# Patient Record
Sex: Male | Born: 1935 | Race: White | Hispanic: No | Marital: Married | Smoking: Never smoker
Health system: Southern US, Community
[De-identification: ages and names within clinical notes are randomized; demographics above are authoritative.]

---

## 1992-08-30 HISTORY — PX: TOTAL HIP ARTHROPLASTY: SHX124

## 1992-08-30 HISTORY — PX: CORONARY ANGIOPLASTY WITH STENT PLACEMENT: SHX49

## 2014-03-23 ENCOUNTER — Emergency Department: Payer: Self-pay | Admitting: Emergency Medicine

## 2020-07-31 ENCOUNTER — Other Ambulatory Visit: Payer: Self-pay | Admitting: Neurology

## 2020-07-31 ENCOUNTER — Other Ambulatory Visit (HOSPITAL_COMMUNITY): Payer: Self-pay | Admitting: Neurology

## 2020-07-31 DIAGNOSIS — G250 Essential tremor: Secondary | ICD-10-CM

## 2020-08-13 ENCOUNTER — Other Ambulatory Visit: Payer: Self-pay

## 2020-08-13 ENCOUNTER — Ambulatory Visit (HOSPITAL_COMMUNITY)
Admission: RE | Admit: 2020-08-13 | Discharge: 2020-08-13 | Disposition: A | Discharge status: Home / Self Care | Payer: Medicare PPO | Source: Ambulatory Visit | Attending: Neurology | Admitting: Neurology

## 2020-08-13 DIAGNOSIS — G250 Essential tremor: Secondary | ICD-10-CM

## 2020-08-13 MED ORDER — GADOBUTROL 1 MMOL/ML IV SOLN
7.0000 mL | Freq: Once | INTRAVENOUS | Status: AC | PRN
  Administered 2020-08-13: 7 mL via INTRAVENOUS

## 2020-10-09 ENCOUNTER — Ambulatory Visit: Payer: Self-pay | Admitting: Nurse Practitioner

## 2021-06-03 ENCOUNTER — Other Ambulatory Visit: Payer: Self-pay

## 2021-06-03 ENCOUNTER — Emergency Department
Admission: EM | Admit: 2021-06-03 | Discharge: 2021-06-03 | Disposition: A | Discharge status: Home / Self Care | Payer: Medicare PPO | Attending: Emergency Medicine | Admitting: Emergency Medicine

## 2021-06-03 ENCOUNTER — Emergency Department: Payer: Medicare PPO

## 2021-06-03 DIAGNOSIS — Z96642 Presence of left artificial hip joint: Secondary | ICD-10-CM | POA: Insufficient documentation

## 2021-06-03 DIAGNOSIS — X501XXA Overexertion from prolonged static or awkward postures, initial encounter: Secondary | ICD-10-CM | POA: Insufficient documentation

## 2021-06-03 DIAGNOSIS — Z955 Presence of coronary angioplasty implant and graft: Secondary | ICD-10-CM | POA: Insufficient documentation

## 2021-06-03 DIAGNOSIS — Y9389 Activity, other specified: Secondary | ICD-10-CM | POA: Insufficient documentation

## 2021-06-03 DIAGNOSIS — M7632 Iliotibial band syndrome, left leg: Secondary | ICD-10-CM | POA: Insufficient documentation

## 2021-06-03 DIAGNOSIS — M79662 Pain in left lower leg: Secondary | ICD-10-CM | POA: Diagnosis present

## 2021-06-03 NOTE — ED Notes (Signed)
Patient stable and discharged with all personal belongings and AVS. AVS and discharge instructions reviewed with patient and opportunity for questions provided.   

## 2021-06-03 NOTE — ED Triage Notes (Signed)
C/O left hip pain.  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  States had to bend and twist because of a low hanging branch.  AAOx3.  Skin warm and dry. NAD. Ambulates with easy and steady gait.

## 2021-06-03 NOTE — Discharge Instructions (Addendum)
LeftYou may use ibuprofen up to 600 mg or naproxen up to 250 mg every 8 hours as needed for continued left leg pain.

## 2021-06-03 NOTE — ED Provider Notes (Signed)
[facility] Emergency Department Provider Note   ____________________________________________   Event Date/Time   First MD Initiated Contact with Patient 06/03/21 1637     (approximate)  I have reviewed the triage vital signs and the nursing notes.   HISTORY  Chief Complaint Hip Pain    HPI Mark Barber is a 87 y.o. male who presents for left lateral lower extremity pain  LOCATION: Left hip to knee on the left lower extremity DURATION: 1 month prior to arrival TIMING: Intermittent but worse today and resolved since onset today SEVERITY: 8/10, currently 0/10 QUALITY: Aching, burning pain CONTEXT: Patient states that he initially injured his left hip and began having pain while mowing the lawn approximately 1 month prior to arrival and has had intermittent pain with walking and certain positions of holding this left leg ever since MODIFYING FACTORS: Walking sometimes worsens this pain and they are partially relieved in the reclined position ASSOCIATED SYMPTOMS: Denies   Per medical record review, patient has history of total hip arthroplasty on the left          History reviewed. No pertinent past medical history.  There are no problems to display for this patient.   Past Surgical History:  Procedure Laterality Date   CORONARY ANGIOPLASTY WITH STENT PLACEMENT  1994   and 1997   TOTAL HIP ARTHROPLASTY Left 1994    Prior to Admission medications   Not on File    Allergies Patient has no known allergies.  History reviewed. No pertinent family history.  Social History    Review of Systems Constitutional: No fever/chills Eyes: No visual changes. ENT: No sore throat. Cardiovascular: Denies chest pain. Respiratory: Denies shortness of breath. Gastrointestinal: No abdominal pain.  No nausea, no vomiting.  No diarrhea. Genitourinary: Negative for dysuria. Musculoskeletal: Positive for left hip and knee pain Skin: Negative for  rash. Neurological: Negative for headaches, weakness/numbness/paresthesias in any extremity Psychiatric: Negative for suicidal ideation/homicidal ideation   ____________________________________________   PHYSICAL EXAM:  VITAL SIGNS: ED Triage Vitals  Enc Vitals Group     BP 06/03/21 1457 138/68     Pulse Rate 06/03/21 1457 68     Resp 06/03/21 1457 16     Temp 06/03/21 1457 97.9 F (36.6 C)     Temp Source 06/03/21 1457 Oral     SpO2 06/03/21 1457 95 %     Weight 06/03/21 1457 159 lb (72.1 kg)     Height 06/03/21 1457 5\' 11"  (1.803 m)     Head Circumference --      Peak Flow --      Pain Score 06/03/21 1449 5     Pain Loc --      Pain Edu? --      Excl. in GC? --    Constitutional: Alert and oriented. Well appearing and in no acute distress. Eyes: Conjunctivae are normal. PERRL. Head: Atraumatic. Nose: No congestion/rhinnorhea. Mouth/Throat: Mucous membranes are moist. Neck: No stridor Cardiovascular: Grossly normal heart sounds.  Good peripheral circulation. Respiratory: Normal respiratory effort.  No retractions. Gastrointestinal: Soft and nontender. No distention. Musculoskeletal: No obvious deformities.  Tenderness with abduction of the left lower extremity past midline as well as mild tenderness to palpation over the entirety of the iliotibial band on the left Neurologic:  Normal speech and language. No gross focal neurologic deficits are appreciated. Skin:  Skin is warm and dry. No rash noted. Psychiatric: Mood and affect are normal. Speech and behavior are normal.  ____________________________________________   LABS (all labs ordered are listed, but only abnormal results are displayed)  Labs Reviewed - No data to display RADIOLOGY  ED MD interpretation: X-ray of the left hip shows no evidence of acute abnormalities including no fractures or dislocations but does show a left total hip arthroplasty  Official radiology report(s): DG Hip Unilat W or Wo Pelvis  2-3 Views Left  Result Date: 06/03/2021 CLINICAL DATA:  Left hip pain for 1 week after injury. EXAM: DG HIP (WITH OR WITHOUT PELVIS) 2-3V LEFT COMPARISON:  None. FINDINGS: Status post left total hip arthroplasty. No fracture or dislocation is noted. IMPRESSION: No acute abnormality is noted. Electronically Signed   By: Lupita Raider M.D.   On: 06/03/2021 15:46    ____________________________________________   PROCEDURES  Procedure(s) performed (including Critical Care):  Procedures   ____________________________________________   INITIAL IMPRESSION / ASSESSMENT AND PLAN / ED COURSE  As part of my medical decision making, I reviewed the following data within the electronic medical record, if available:  Nursing notes reviewed and incorporated, Labs reviewed, EKG interpreted, Old chart reviewed, Radiograph reviewed and Notes from prior ED visits reviewed and incorporated        87 year old male presents for left hip, left lateral thigh, and left knee pain for the last month Given history, exam and workup I have low suspicion for fracture, dislocation, significant ligamentous injury, septic arthritis, gout flare, new autoimmune arthropathy, or gonococcal arthropathy.  Interventions: X-ray of the left hip does not show any evidence of acute abnormalities  Given history and physical exam, I have concern for patient possibly suffering from iliotibial syndrome on the left.  Patient has been experiencing the symptoms over the past month and therefore we will try a short course of corticosteroids as well as instructions to follow-up with the orthopedic surgeon for further evaluation and management. Disposition: Discharge home with strict return precautions and instructions for prompt primary care follow up in the next week.      ____________________________________________   FINAL CLINICAL IMPRESSION(S) / ED DIAGNOSES  Final diagnoses:  Iliotibial band syndrome affecting left lower  leg     ED Discharge Orders     None        Note:  This document was prepared using Dragon voice recognition software and may include unintentional dictation errors.    Merwyn Katos, MD 06/03/21 [id number]

## 2021-06-03 NOTE — ED Notes (Signed)
See triage note. Pt states injury possibly began while lawn mowing approx 1 month ago when he ran under an apple tree which "twisted" him in the seat and dislocated a finger on his right hand. Pt states Sx are intermittent but today became severe while walking. Pain described as sharp and radiates from above left hip down back of left leg all the way to the heel. Upon arrival. Pt ambulatory, A&Ox4.

## 2021-08-06 DIAGNOSIS — I700 Atherosclerosis of aorta: Secondary | ICD-10-CM | POA: Diagnosis not present

## 2021-08-06 DIAGNOSIS — S2232XA Fracture of one rib, left side, initial encounter for closed fracture: Secondary | ICD-10-CM | POA: Diagnosis not present

## 2021-08-06 DIAGNOSIS — W010XXA Fall on same level from slipping, tripping and stumbling without subsequent striking against object, initial encounter: Secondary | ICD-10-CM | POA: Diagnosis not present

## 2021-08-06 DIAGNOSIS — S299XXA Unspecified injury of thorax, initial encounter: Secondary | ICD-10-CM | POA: Diagnosis not present

## 2021-09-22 DIAGNOSIS — L570 Actinic keratosis: Secondary | ICD-10-CM | POA: Diagnosis not present

## 2021-09-22 DIAGNOSIS — Z85828 Personal history of other malignant neoplasm of skin: Secondary | ICD-10-CM | POA: Diagnosis not present

## 2021-09-22 DIAGNOSIS — L814 Other melanin hyperpigmentation: Secondary | ICD-10-CM | POA: Diagnosis not present

## 2021-09-22 DIAGNOSIS — L723 Sebaceous cyst: Secondary | ICD-10-CM | POA: Diagnosis not present

## 2021-10-12 DIAGNOSIS — E749 Disorder of carbohydrate metabolism, unspecified: Secondary | ICD-10-CM | POA: Diagnosis not present

## 2021-10-12 DIAGNOSIS — H60591 Other noninfective acute otitis externa, right ear: Secondary | ICD-10-CM | POA: Diagnosis not present

## 2021-10-12 DIAGNOSIS — E785 Hyperlipidemia, unspecified: Secondary | ICD-10-CM | POA: Diagnosis not present

## 2021-10-12 DIAGNOSIS — D649 Anemia, unspecified: Secondary | ICD-10-CM | POA: Diagnosis not present

## 2021-10-12 DIAGNOSIS — I259 Chronic ischemic heart disease, unspecified: Secondary | ICD-10-CM | POA: Diagnosis not present

## 2021-10-12 DIAGNOSIS — M67843 Other specified disorders of tendon, right hand: Secondary | ICD-10-CM | POA: Diagnosis not present

## 2021-10-12 DIAGNOSIS — I6522 Occlusion and stenosis of left carotid artery: Secondary | ICD-10-CM | POA: Diagnosis not present

## 2021-10-14 DIAGNOSIS — M65341 Trigger finger, right ring finger: Secondary | ICD-10-CM | POA: Diagnosis not present

## 2021-10-19 DIAGNOSIS — M65341 Trigger finger, right ring finger: Secondary | ICD-10-CM | POA: Diagnosis not present

## 2021-10-31 ENCOUNTER — Other Ambulatory Visit: Payer: Self-pay

## 2021-10-31 ENCOUNTER — Emergency Department: Payer: Medicare PPO

## 2021-10-31 ENCOUNTER — Emergency Department
Admission: EM | Admit: 2021-10-31 | Discharge: 2021-10-31 | Disposition: A | Discharge status: Home / Self Care | Payer: Medicare PPO | Attending: Emergency Medicine | Admitting: Emergency Medicine

## 2021-10-31 DIAGNOSIS — Z20822 Contact with and (suspected) exposure to covid-19: Secondary | ICD-10-CM | POA: Insufficient documentation

## 2021-10-31 DIAGNOSIS — I7143 Infrarenal abdominal aortic aneurysm, without rupture: Secondary | ICD-10-CM | POA: Diagnosis not present

## 2021-10-31 DIAGNOSIS — N400 Enlarged prostate without lower urinary tract symptoms: Secondary | ICD-10-CM | POA: Diagnosis not present

## 2021-10-31 DIAGNOSIS — I10 Essential (primary) hypertension: Secondary | ICD-10-CM | POA: Diagnosis not present

## 2021-10-31 DIAGNOSIS — R109 Unspecified abdominal pain: Secondary | ICD-10-CM | POA: Diagnosis not present

## 2021-10-31 DIAGNOSIS — K449 Diaphragmatic hernia without obstruction or gangrene: Secondary | ICD-10-CM

## 2021-10-31 DIAGNOSIS — I2510 Atherosclerotic heart disease of native coronary artery without angina pectoris: Secondary | ICD-10-CM | POA: Diagnosis not present

## 2021-10-31 DIAGNOSIS — Z96642 Presence of left artificial hip joint: Secondary | ICD-10-CM | POA: Diagnosis not present

## 2021-10-31 DIAGNOSIS — R8289 Other abnormal findings on cytological and histological examination of urine: Secondary | ICD-10-CM | POA: Diagnosis not present

## 2021-10-31 DIAGNOSIS — R1111 Vomiting without nausea: Secondary | ICD-10-CM

## 2021-10-31 DIAGNOSIS — R112 Nausea with vomiting, unspecified: Secondary | ICD-10-CM | POA: Diagnosis not present

## 2021-10-31 DIAGNOSIS — R319 Hematuria, unspecified: Secondary | ICD-10-CM

## 2021-10-31 DIAGNOSIS — R251 Tremor, unspecified: Secondary | ICD-10-CM | POA: Insufficient documentation

## 2021-10-31 DIAGNOSIS — I4510 Unspecified right bundle-branch block: Secondary | ICD-10-CM | POA: Diagnosis not present

## 2021-10-31 DIAGNOSIS — K5730 Diverticulosis of large intestine without perforation or abscess without bleeding: Secondary | ICD-10-CM | POA: Diagnosis not present

## 2021-10-31 DIAGNOSIS — R1110 Vomiting, unspecified: Secondary | ICD-10-CM | POA: Diagnosis not present

## 2021-10-31 DIAGNOSIS — K5790 Diverticulosis of intestine, part unspecified, without perforation or abscess without bleeding: Secondary | ICD-10-CM

## 2021-10-31 LAB — COMPREHENSIVE METABOLIC PANEL
ALT: 25 U/L (ref 0–44)
AST: 30 U/L (ref 15–41)
Albumin: 3.8 g/dL (ref 3.5–5.0)
Alkaline Phosphatase: 57 U/L (ref 38–126)
Anion gap: 9 (ref 5–15)
BUN: 31 mg/dL — ABNORMAL HIGH (ref 8–23)
CO2: 25 mmol/L (ref 22–32)
Calcium: 8.7 mg/dL — ABNORMAL LOW (ref 8.9–10.3)
Chloride: 102 mmol/L (ref 98–111)
Creatinine, Ser: 0.92 mg/dL (ref 0.61–1.24)
GFR, Estimated: >60 mL/min (ref >60)
Glucose, Bld: 136 mg/dL — ABNORMAL HIGH (ref 70–99)
Potassium: 4.1 mmol/L (ref 3.5–5.1)
Sodium: 136 mmol/L (ref 135–145)
Total Bilirubin: 0.7 mg/dL (ref 0.3–1.2)
Total Protein: 7 g/dL (ref 6.5–8.1)

## 2021-10-31 LAB — CBC
HCT: 40.4 % (ref 39.0–52.0)
Hemoglobin: 13.5 g/dL (ref 13.0–17.0)
MCH: 31.5 pg (ref 26.0–34.0)
MCHC: 33.4 g/dL (ref 30.0–36.0)
MCV: 94.4 fL (ref 80.0–100.0)
Platelets: 176 10*3/uL (ref 150–400)
RBC: 4.28 MIL/uL (ref 4.22–5.81)
RDW: 12.7 % (ref 11.5–15.5)
WBC: 9.3 10*3/uL (ref 4.0–10.5)
nRBC: 0 % (ref 0.0–0.2)

## 2021-10-31 LAB — URINALYSIS, ROUTINE W REFLEX MICROSCOPIC
Bilirubin Urine: NEGATIVE
Glucose, UA: NEGATIVE mg/dL
Ketones, ur: 5 mg/dL — AB
Leukocytes,Ua: NEGATIVE
Nitrite: NEGATIVE
Protein, ur: 30 mg/dL — AB
Specific Gravity, Urine: 1.024 (ref 1.005–1.030)
Squamous Epithelial / HPF: NONE SEEN (ref 0–5)
pH: 5 (ref 5.0–8.0)

## 2021-10-31 LAB — RESP PANEL BY RT-PCR (FLU A&B, COVID) ARPGX2
Influenza A by PCR: NEGATIVE
Influenza B by PCR: NEGATIVE
SARS Coronavirus 2 by RT PCR: NEGATIVE

## 2021-10-31 LAB — TROPONIN I (HIGH SENSITIVITY)
Troponin I (High Sensitivity): 8 ng/L (ref <18)
Troponin I (High Sensitivity): 10 ng/L (ref <18)

## 2021-10-31 LAB — MAGNESIUM: Magnesium: 1.9 mg/dL (ref 1.7–2.4)

## 2021-10-31 LAB — LIPASE, BLOOD: Lipase: 34 U/L (ref 11–51)

## 2021-10-31 MED ORDER — IOHEXOL 300 MG/ML  SOLN
100.0000 mL | Freq: Once | INTRAMUSCULAR | Status: AC | PRN
  Administered 2021-10-31: 100 mL via INTRAVENOUS

## 2021-10-31 MED ORDER — LACTATED RINGERS IV BOLUS
1000.0000 mL | Freq: Once | INTRAVENOUS | Status: AC
  Administered 2021-10-31: 1000 mL via INTRAVENOUS

## 2021-10-31 NOTE — Discharge Instructions (Addendum)
Your CT today showed: ?FINDINGS: ?Lower chest: There is no focal consolidation in the visualized lower ?lung fields. Coronary artery calcifications are seen. Minimal ?pericardial effusion is present. ?  ?Hepatobiliary: There is fatty infiltration in the liver. There is 6 ?mm low-density in the left lobe of liver, possibly a cyst or ?hemangioma. There is small calcification in the right lobe. ?Gallbladder is unremarkable. ?  ?Pancreas: No focal abnormality is seen. ?  ?Spleen: Unremarkable ?  ?Adrenals/Urinary Tract: Adrenals are unremarkable. There is no ?hydronephrosis. Few small parapelvic cysts seen in both kidneys. ?There are no renal or ureteral stones. Urinary bladder is partly ?obscured by beam hardening artifacts from orthopedic hardware. ?  ?Stomach/Bowel: Small hiatal hernia is seen. Small bowel loops are ?not dilated. Appendix is not dilated. There is no significant wall ?thickening in colon. Scattered diverticula are seen in colon without ?signs of focal acute diverticulitis. ?  ?Vascular/Lymphatic: Atherosclerotic plaques and calcifications are ?seen in the aorta and its major branches. There is 3 cm infrarenal ?aortic aneurysm. ?  ?Reproductive: Prostate is enlarged. Evaluation of prostate is ?limited by beam hardening artifacts. ?  ?Other: There is no ascites or pneumoperitoneum. ?  ?Musculoskeletal: Degenerative changes are noted in the lumbar spine, ?particularly prominent from L2-L5 levels on the right side. ?  ?IMPRESSION: ?There is no evidence of intestinal obstruction or pneumoperitoneum. ?There is no hydronephrosis. Appendix is not dilated. ?

## 2021-10-31 NOTE — ED Triage Notes (Signed)
Pt states that last night he started to feel faint and then he thew up x3- pt has since had increased weakness and shakiness- pt states he normally has a slight tremor, but this is worse than his normal- pt has not had any vomiting this AM- pt denies any pain ?

## 2021-10-31 NOTE — ED Provider Triage Note (Signed)
Emergency Medicine Provider Triage Evaluation Note ? ?Darnelle Maffucci , a 88 y.o. male  was evaluated in triage.  Pt complains of vomiting x 3 last night.  No diarrhea.Increased shakiness and feels weak.   Last BM this am.  Wife not aware of exposure to Covid.  Both have had vaccine.   ? ?Review of Systems  ?Positive: vomiting ?Negative: No known fever. No URI sx. ? ?Physical Exam  ?BP 131/70 (BP Location: Left Arm)   Pulse 82   Temp 99.3 ?F (37.4 ?C) (Oral)   Resp 18   Ht 6' (1.829 m)   Wt 73.9 kg   SpO2 98%   BMI 22.11 kg/m?  ?Gen:   Awake, no distress  Alert. ?Resp:  Normal effort  Lungs clear bilat.   ?MSK:   Moves extremities without difficulty  ?Other:  Abdomen soft non-tender.  BS+ ? ?Medical Decision Making  ?Medically screening exam initiated at 10:05 AM.  Appropriate orders placed.  CARLISS KATAOKA was informed that the remainder of the evaluation will be completed by another provider, this initial triage assessment does not replace that evaluation, and the importance of remaining in the ED until their evaluation is complete. ? ? ?  ?Johnn Hai, PA-C ?10/31/21 1011 ? ?

## 2021-10-31 NOTE — ED Notes (Signed)
EDP at [facility] 

## 2021-10-31 NOTE — ED Provider Notes (Signed)
? ?[facility] ?Provider Note ? ? ? Event Date/Time  ? First MD Initiated Contact with Patient 10/31/21 1120   ?  (approximate) ? ? ?History  ? ?Weakness ? ? ?HPI ? ?Mark Barber is a 88 y.o. male with a past medical history on chart review from recent PCP visit from 12/8 of CAD, some baseline weakness in the left leg from remote surgery, HTN, arthritis and tremor who presents for evaluation of some vomiting experienced earlier this morning.  He states he feels generally weak after this.  Seems he had 3 episodes nearly back to back but has not had any subsequent vomiting and currently has no nausea.  States she had a normal chicken soup last night and his wife is not sick.  He denies any headache, focal weakness, change in his chronic tremor which is primarily in his right upper extremity, chest pain, cough, shortness of breath, abdominal pain, back pain, diarrhea, constipation, problems urinating rash or extremity pain.  No recent falls or injuries.  No new medications.  No other acute concerns at this time. ? ?  ?History reviewed. No pertinent past medical history. ? ?Past Surgical History:  ?Procedure Laterality Date  ? CORONARY ANGIOPLASTY WITH STENT PLACEMENT  1994  ? and 1997  ? TOTAL HIP ARTHROPLASTY Left 1994  ? ? ? ?Physical Exam  ?Triage Vital Signs: ?ED Triage Vitals  ?Enc Vitals Group  ?   BP 10/31/21 0945 131/70  ?   Pulse Rate 10/31/21 0945 82  ?   Resp 10/31/21 0945 18  ?   Temp 10/31/21 0945 99.3 ?F (37.4 ?C)  ?   Temp Source 10/31/21 0945 Oral  ?   SpO2 10/31/21 0945 98 %  ?   Weight 10/31/21 0942 163 lb (73.9 kg)  ?   Height 10/31/21 0942 6' (1.829 m)  ?   Head Circumference --   ?   Peak Flow --   ?   Pain Score 10/31/21 0942 0  ?   Pain Loc --   ?   Pain Edu? --   ?   Excl. in GC? --   ? ? ?Most recent vital signs: ?Vitals:  ? 10/31/21 1250 10/31/21 1255  ?BP:    ?Pulse: 78 84  ?Resp:    ?Temp:    ?SpO2: 99% 98%  ? ? ?General: Awake, no distress.  ?CV:  Slightly prolonged  capillary fill in the digits..  2+ radial pulses. ?Resp:  Normal effort.  Clear bilaterally. ?Abd:  No distention.  Soft throughout. ?Other:  2 through 12 are grossly intact.  There is tremor in the right upper extremity and a little bit in the left upper extremity but more pronounced in the right.  Strength is otherwise symmetric throughout all extremities with exception of slightly decreased drink in the left hip on flexion extension compared to the right which patient states is baseline and chronic.Marland Kitchen  Sensation is intact light touch all extremities.  No pronator drift.  No finger dysmetria. ? ? ?ED Results / Procedures / Treatments  ?Labs ?(all labs ordered are listed, but only abnormal results are displayed) ?Labs Reviewed  ?URINALYSIS, ROUTINE W REFLEX MICROSCOPIC - Abnormal; Notable for the following components:  ?    Result Value  ? Color, Urine YELLOW (*)   ? APPearance HAZY (*)   ? Hgb urine dipstick LARGE (*)   ? Ketones, ur 5 (*)   ? Protein, ur 30 (*)   ? Bacteria,  UA RARE (*)   ? All other components within normal limits  ?COMPREHENSIVE METABOLIC PANEL - Abnormal; Notable for the following components:  ? Glucose, Bld 136 (*)   ? BUN 31 (*)   ? Calcium 8.7 (*)   ? All other components within normal limits  ?RESP PANEL BY RT-PCR (FLU A&B, COVID) ARPGX2  ?CBC  ?MAGNESIUM  ?LIPASE, BLOOD  ?TROPONIN I (HIGH SENSITIVITY)  ?TROPONIN I (HIGH SENSITIVITY)  ? ? ? ?EKG ? ?ECG is remarkable sinus rhythm with a ventricular rate of 83, first-degree block with a PR interval of 220, right bundle branch block with some nonspecific ST changes throughout without other evidence of acute ischemia or significant arrhythmia. ? ? ?RADIOLOGY ? ?CT abdomen pelvis on my interpretation without evidence of a kidney stone, perinephric stranding, diverticulitis, appendicitis, cholecystitis, or other clear acute abdominal or pelvic process.  No evidence of an SBO.  I also reviewed radiology interpretation and agree with their findings  of 3 cm infrarenal aortic aneurysm, diverticulosis, enlarged prostate, heel hernia and fatty liver.   ? ? ?PROCEDURES: ? ?Critical Care performed: No ? ?Procedures ? ? ?MEDICATIONS ORDERED IN ED: ?Medications  ?lactated ringers bolus 1,000 mL (1,000 mLs Intravenous New Bag/Given 10/31/21 1217)  ?iohexol (OMNIPAQUE) 300 MG/ML solution 100 mL (100 mLs Intravenous Contrast Given 10/31/21 1227)  ? ? ? ?IMPRESSION / MDM / ASSESSMENT AND PLAN / ED COURSE  ?I reviewed the triage vital signs and the nursing notes. ?             ?               ? ?Differential diagnosis includes, but is not limited to acute infectious gastritis, anginal equivalent from ACS, arrhythmia, anemia, metabolic derangements, pancreatitis, cholecystitis, cystitis, acute viral infection ? ?No history or exam features to suggest a CVA, SAH i.e. no history of headache or trauma, ruptured AAA ? ?ECG is remarkable sinus rhythm with a ventricular rate of 83, first-degree block with a PR interval of 220, right bundle branch block with some nonspecific ST changes throughout without other evidence of acute ischemia or significant arrhythmia. ? ?CT abdomen pelvis on my interpretation without evidence of a kidney stone, perinephric stranding, diverticulitis, appendicitis, cholecystitis, or other clear acute abdominal or pelvic process.  No evidence of an SBO.  I also reviewed radiology interpretation and agree with their findings of 3 cm infrarenal aortic aneurysm, diverticulosis, enlarged prostate, heel hernia and fatty liver.  Discussed the sentimental findings with patient and that he can follow-up with his PCP for any further surveillance imaging is indicated. ? ?Given nonelevated troponin x2 I have low suspicion for ACS or myocarditis.  Lipase not consistent with acute pancreatitis.  CBC without leukocytosis or acute anemia.  CMP without any significant electrolyte or metabolic derangements.  No evidence of hepatitis or cholestasis.  COVID influenza PCR is  negative.  UA has some hemoglobin and red bacteria but no other evidence of infection. ? ?Patient states he has not had any subsequent vomiting or any nausea or pain or other symptoms since this occurred late last night or early this morning.  Given stable vitals with very reassuring ED work-up patient tolerating p.o. without difficulty with no return of symptoms after 4 hours emergency room I think he is stable for discharge with outpatient follow-up.  Unclear precise etiology for his vomiting last night.  I considered additional diagnostic studies and observation given his comorbidities and age at this point I think he is  stable for PCP follow-up.  Discharged in stable condition.  Strict return precautions advised and discussed.  Discussed incidental findings on CT and recommendation to follow these up with PCP who can coordinate surveillance imaging further work-up as needed. ? ?  ? ? ?FINAL CLINICAL IMPRESSION(S) / ED DIAGNOSES  ? ?Final diagnoses:  ?Vomiting without nausea, unspecified vomiting type  ?Coronary artery disease involving native heart without angina pectoris, unspecified vessel or lesion type  ?Hiatal hernia  ?Enlarged prostate  ?Hematuria, unspecified type  ?RBBB  ?Infrarenal abdominal aortic aneurysm (AAA) without rupture  ?Diverticulosis  ? ? ? ?Rx / DC Orders  ? ?ED Discharge Orders   ? ? None  ? ?  ? ? ? ?Note:  This document was prepared using Dragon voice recognition software and may include unintentional dictation errors. ?  ?Gilles Chiquito, MD ?10/31/21 1317 ? ?

## 2021-11-25 DIAGNOSIS — M65341 Trigger finger, right ring finger: Secondary | ICD-10-CM | POA: Diagnosis not present

## 2021-12-03 DIAGNOSIS — L723 Sebaceous cyst: Secondary | ICD-10-CM | POA: Diagnosis not present

## 2021-12-03 DIAGNOSIS — L089 Local infection of the skin and subcutaneous tissue, unspecified: Secondary | ICD-10-CM | POA: Diagnosis not present

## 2021-12-07 DIAGNOSIS — L720 Epidermal cyst: Secondary | ICD-10-CM | POA: Diagnosis not present

## 2021-12-22 DIAGNOSIS — M24541 Contracture, right hand: Secondary | ICD-10-CM | POA: Diagnosis not present

## 2021-12-31 DIAGNOSIS — G250 Essential tremor: Secondary | ICD-10-CM | POA: Diagnosis not present

## 2021-12-31 DIAGNOSIS — Z8739 Personal history of other diseases of the musculoskeletal system and connective tissue: Secondary | ICD-10-CM | POA: Diagnosis not present

## 2022-02-25 DIAGNOSIS — L720 Epidermal cyst: Secondary | ICD-10-CM | POA: Diagnosis not present

## 2022-03-04 DIAGNOSIS — L720 Epidermal cyst: Secondary | ICD-10-CM | POA: Diagnosis not present

## 2022-03-23 DIAGNOSIS — M67843 Other specified disorders of tendon, right hand: Secondary | ICD-10-CM | POA: Diagnosis not present

## 2022-03-23 DIAGNOSIS — E785 Hyperlipidemia, unspecified: Secondary | ICD-10-CM | POA: Diagnosis not present

## 2022-03-23 DIAGNOSIS — I259 Chronic ischemic heart disease, unspecified: Secondary | ICD-10-CM | POA: Diagnosis not present

## 2022-03-23 DIAGNOSIS — E749 Disorder of carbohydrate metabolism, unspecified: Secondary | ICD-10-CM | POA: Diagnosis not present

## 2022-03-23 DIAGNOSIS — I6522 Occlusion and stenosis of left carotid artery: Secondary | ICD-10-CM | POA: Diagnosis not present

## 2022-03-23 DIAGNOSIS — D649 Anemia, unspecified: Secondary | ICD-10-CM | POA: Diagnosis not present

## 2022-04-15 DIAGNOSIS — Z6822 Body mass index (BMI) 22.0-22.9, adult: Secondary | ICD-10-CM | POA: Diagnosis not present

## 2022-04-15 DIAGNOSIS — G250 Essential tremor: Secondary | ICD-10-CM | POA: Diagnosis not present

## 2022-04-30 DIAGNOSIS — G250 Essential tremor: Secondary | ICD-10-CM | POA: Diagnosis not present

## 2022-05-29 DIAGNOSIS — W01198A Fall on same level from slipping, tripping and stumbling with subsequent striking against other object, initial encounter: Secondary | ICD-10-CM | POA: Diagnosis not present

## 2022-05-29 DIAGNOSIS — S299XXA Unspecified injury of thorax, initial encounter: Secondary | ICD-10-CM | POA: Diagnosis not present

## 2022-05-31 DIAGNOSIS — S299XXA Unspecified injury of thorax, initial encounter: Secondary | ICD-10-CM | POA: Diagnosis not present

## 2022-05-31 DIAGNOSIS — I700 Atherosclerosis of aorta: Secondary | ICD-10-CM | POA: Diagnosis not present

## 2022-05-31 DIAGNOSIS — M419 Scoliosis, unspecified: Secondary | ICD-10-CM | POA: Diagnosis not present

## 2022-06-07 DIAGNOSIS — S0990XD Unspecified injury of head, subsequent encounter: Secondary | ICD-10-CM | POA: Diagnosis not present

## 2022-06-07 DIAGNOSIS — S299XXA Unspecified injury of thorax, initial encounter: Secondary | ICD-10-CM | POA: Diagnosis not present

## 2022-06-07 DIAGNOSIS — I700 Atherosclerosis of aorta: Secondary | ICD-10-CM | POA: Diagnosis not present

## 2022-06-07 DIAGNOSIS — S299XXD Unspecified injury of thorax, subsequent encounter: Secondary | ICD-10-CM | POA: Diagnosis not present

## 2022-06-07 DIAGNOSIS — Z9181 History of falling: Secondary | ICD-10-CM | POA: Diagnosis not present

## 2022-07-26 DIAGNOSIS — I259 Chronic ischemic heart disease, unspecified: Secondary | ICD-10-CM | POA: Diagnosis not present

## 2022-07-26 DIAGNOSIS — Z1331 Encounter for screening for depression: Secondary | ICD-10-CM | POA: Diagnosis not present

## 2022-07-26 DIAGNOSIS — M67843 Other specified disorders of tendon, right hand: Secondary | ICD-10-CM | POA: Diagnosis not present

## 2022-07-26 DIAGNOSIS — E785 Hyperlipidemia, unspecified: Secondary | ICD-10-CM | POA: Diagnosis not present

## 2022-07-26 DIAGNOSIS — I6522 Occlusion and stenosis of left carotid artery: Secondary | ICD-10-CM | POA: Diagnosis not present

## 2022-07-26 DIAGNOSIS — E749 Disorder of carbohydrate metabolism, unspecified: Secondary | ICD-10-CM | POA: Diagnosis not present

## 2022-07-26 DIAGNOSIS — D649 Anemia, unspecified: Secondary | ICD-10-CM | POA: Diagnosis not present

## 2022-07-26 DIAGNOSIS — Z0001 Encounter for general adult medical examination with abnormal findings: Secondary | ICD-10-CM | POA: Diagnosis not present

## 2022-07-26 DIAGNOSIS — S2242XD Multiple fractures of ribs, left side, subsequent encounter for fracture with routine healing: Secondary | ICD-10-CM | POA: Diagnosis not present

## 2022-07-27 DIAGNOSIS — I259 Chronic ischemic heart disease, unspecified: Secondary | ICD-10-CM | POA: Diagnosis not present

## 2022-07-27 DIAGNOSIS — E749 Disorder of carbohydrate metabolism, unspecified: Secondary | ICD-10-CM | POA: Diagnosis not present

## 2022-07-30 DIAGNOSIS — H2513 Age-related nuclear cataract, bilateral: Secondary | ICD-10-CM | POA: Diagnosis not present

## 2022-08-11 DIAGNOSIS — G250 Essential tremor: Secondary | ICD-10-CM | POA: Diagnosis not present

## 2022-08-17 DIAGNOSIS — G250 Essential tremor: Secondary | ICD-10-CM | POA: Diagnosis not present

## 2022-08-24 DIAGNOSIS — D649 Anemia, unspecified: Secondary | ICD-10-CM | POA: Diagnosis not present

## 2022-08-24 DIAGNOSIS — E785 Hyperlipidemia, unspecified: Secondary | ICD-10-CM | POA: Diagnosis not present

## 2022-08-24 DIAGNOSIS — M67843 Other specified disorders of tendon, right hand: Secondary | ICD-10-CM | POA: Diagnosis not present

## 2022-08-24 DIAGNOSIS — I259 Chronic ischemic heart disease, unspecified: Secondary | ICD-10-CM | POA: Diagnosis not present

## 2022-08-24 DIAGNOSIS — E749 Disorder of carbohydrate metabolism, unspecified: Secondary | ICD-10-CM | POA: Diagnosis not present

## 2022-08-24 DIAGNOSIS — G250 Essential tremor: Secondary | ICD-10-CM | POA: Diagnosis not present

## 2022-08-24 DIAGNOSIS — I6522 Occlusion and stenosis of left carotid artery: Secondary | ICD-10-CM | POA: Diagnosis not present

## 2022-09-06 DIAGNOSIS — Z09 Encounter for follow-up examination after completed treatment for conditions other than malignant neoplasm: Secondary | ICD-10-CM | POA: Diagnosis not present

## 2022-09-06 DIAGNOSIS — G250 Essential tremor: Secondary | ICD-10-CM | POA: Diagnosis not present

## 2022-12-09 DIAGNOSIS — G250 Essential tremor: Secondary | ICD-10-CM | POA: Diagnosis not present

## 2022-12-09 DIAGNOSIS — R9089 Other abnormal findings on diagnostic imaging of central nervous system: Secondary | ICD-10-CM | POA: Diagnosis not present

## 2023-02-25 ENCOUNTER — Ambulatory Visit: Payer: Medicare PPO | Admitting: Internal Medicine

## 2023-02-25 VITALS — BP 115/70 | HR 82 | Ht 72.0 in | Wt 168.2 lb

## 2023-02-25 DIAGNOSIS — I499 Cardiac arrhythmia, unspecified: Secondary | ICD-10-CM

## 2023-02-25 DIAGNOSIS — I482 Chronic atrial fibrillation, unspecified: Secondary | ICD-10-CM

## 2023-02-25 DIAGNOSIS — E782 Mixed hyperlipidemia: Secondary | ICD-10-CM | POA: Diagnosis not present

## 2023-02-25 MED ORDER — APIXABAN (ELIQUIS) VTE STARTER PACK (10MG AND 5MG): ORAL_TABLET | ORAL | 0 refills | Status: DC

## 2023-02-25 NOTE — Progress Notes (Signed)
Established Patient Office Visit  Subjective:  Patient ID: Mark Barber, male    DOB: 10-06-1933  Age: 89 y.o. MRN: [id number]  Chief Complaint  Patient presents with   Follow-up    6 mo F/U     No new complaints, here for lab review and medication refills. No ER or [facility] visits. No palpitations or SOB.    No other concerns at this time.   No past medical history on file.  Past Surgical History:  Procedure Laterality Date   CORONARY ANGIOPLASTY WITH STENT PLACEMENT  1994   and 1997   TOTAL HIP ARTHROPLASTY Left 1994    Social History   Socioeconomic History   Marital status: Married    Spouse name: Not on file   Number of children: Not on file   Years of education: Not on file   Highest education level: Not on file  Occupational History   Not on file  Tobacco Use   Smoking status: Not on file   Smokeless tobacco: Not on file  Substance and Sexual Activity   Alcohol use: Not on file   Drug use: Not on file   Sexual activity: Not on file  Other Topics Concern   Not on file  Social History Narrative   Not on file   Social Determinants of Health   Financial Resource Strain: Not on file  Food Insecurity: Not on file  Transportation Needs: Not on file  Physical Activity: Not on file  Stress: Not on file  Social Connections: Not on file  Intimate Partner Violence: Not on file    No family history on file.  No Known Allergies  Review of Systems  Constitutional: Negative.   HENT: Negative.    Eyes: Negative.   Respiratory: Negative.    Cardiovascular: Negative.   Gastrointestinal: Negative.   Genitourinary: Negative.   Skin: Negative.   Neurological: Negative.   Endo/Heme/Allergies: Negative.        Objective:   BP 115/70   Pulse 82   Ht 6' (1.829 m)   Wt 168 lb 3.2 oz (76.3 kg)   SpO2 95%   BMI 22.81 kg/m   Vitals:   02/25/23 1431  BP: 115/70  Pulse: 82  Height: 6' (1.829 m)  Weight: 168 lb 3.2 oz (76.3 kg)  SpO2: 95%  BMI  (Calculated): 22.81    Physical Exam Vitals reviewed.  Constitutional:      Appearance: Normal appearance.  HENT:     Head: Normocephalic.     Left Ear: There is no impacted cerumen.     Nose: Nose normal.     Mouth/Throat:     Mouth: Mucous membranes are moist.     Pharynx: No posterior oropharyngeal erythema.  Eyes:     Extraocular Movements: Extraocular movements intact.     Pupils: Pupils are equal, round, and reactive to light.  Cardiovascular:     Rate and Rhythm: Rhythm irregularly irregular.     Chest Wall: PMI is not displaced.     Pulses: Normal pulses.     Heart sounds: Normal heart sounds. No murmur heard. Pulmonary:     Effort: Pulmonary effort is normal.     Breath sounds: Normal air entry. No rhonchi or rales.  Abdominal:     General: Abdomen is flat. Bowel sounds are normal. There is no distension.     Palpations: Abdomen is soft. There is no hepatomegaly, splenomegaly or mass.     Tenderness: There is no  abdominal tenderness.  Musculoskeletal:        General: Normal range of motion.     Cervical back: Normal range of motion and neck supple.     Right knee: Deformity present.     Left knee: Deformity present.     Right lower leg: No edema.     Left lower leg: No edema.  Skin:    General: Skin is warm and dry.  Neurological:     General: No focal deficit present.     Mental Status: He is alert and oriented to person, place, and time.     Cranial Nerves: No cranial nerve deficit.     Motor: No weakness.  Psychiatric:        Mood and Affect: Mood normal.        Behavior: Behavior normal.      No results found for any visits on 02/25/23.  No results found for this or any previous visit (from the past 2160 hour(Helyn Schwan)).    Assessment & Plan:  As per problem list. Rate controlled afib just add stroke prophylaxis as Chadsvasc 2 score is 2 Problem List Items Addressed This Visit       Other   Mixed hyperlipidemia   Relevant Medications    atorvastatin (LIPITOR) 40 MG tablet   aspirin EC 81 MG tablet   APIXABAN (ELIQUIS) VTE STARTER PACK (10MG  AND 5MG )   Other Visit Diagnoses     Cardiac arrhythmia, unspecified cardiac arrhythmia type    -  Primary   Relevant Medications   atorvastatin (LIPITOR) 40 MG tablet   aspirin EC 81 MG tablet   APIXABAN (ELIQUIS) VTE STARTER PACK (10MG  AND 5MG )   Other Relevant Orders   EKG 12-Lead   Chronic atrial fibrillation (HCC)       Relevant Medications   atorvastatin (LIPITOR) 40 MG tablet   aspirin EC 81 MG tablet   APIXABAN (ELIQUIS) VTE STARTER PACK (10MG  AND 5MG )   Other Relevant Orders   Ambulatory referral to Cardiology       Return in about 3 weeks (around 03/18/2023) for lab results.   Total time spent: 30 minutes  Luna Fuse, MD  02/25/2023   This document may have been prepared by [facility] Voice Recognition software and as such may include unintentional dictation errors.

## 2023-02-28 ENCOUNTER — Encounter: Payer: Self-pay | Admitting: Cardiovascular Disease

## 2023-02-28 ENCOUNTER — Ambulatory Visit: Payer: Medicare PPO | Admitting: Cardiovascular Disease

## 2023-02-28 VITALS — BP 120/75 | HR 80 | Ht 72.0 in | Wt 165.2 lb

## 2023-02-28 DIAGNOSIS — Z8679 Personal history of other diseases of the circulatory system: Secondary | ICD-10-CM | POA: Diagnosis not present

## 2023-02-28 DIAGNOSIS — R0602 Shortness of breath: Secondary | ICD-10-CM

## 2023-02-28 DIAGNOSIS — E782 Mixed hyperlipidemia: Secondary | ICD-10-CM

## 2023-02-28 DIAGNOSIS — I2510 Atherosclerotic heart disease of native coronary artery without angina pectoris: Secondary | ICD-10-CM

## 2023-02-28 MED ORDER — AMIODARONE HCL 200 MG PO TABS: ORAL_TABLET | ORAL | 0 refills | Status: DC

## 2023-02-28 NOTE — Progress Notes (Addendum)
Cardiology Office Note   Date:  02/28/2023   ID:  Mark Barber, DOB 28-Jun-1934, MRN [id number]  PCP:  [name], MD  Cardiologist:  Adrian Blackwater, MD      History of Present Illness: Mark Barber is a 89 y.o. male who presents for  Chief Complaint  Patient presents with   Establish Care    Consult     Has new onset atrial fibrillation. Denies, SOB, chest pain or palpitation      History reviewed. No pertinent past medical history.   Past Surgical History:  Procedure Laterality Date   CORONARY ANGIOPLASTY WITH STENT PLACEMENT  1994   and 1997   TOTAL HIP ARTHROPLASTY Left 1994     Current Outpatient Medications  Medication Sig Dispense Refill   amiodarone (PACERONE) 200 MG tablet Take 4 tablets/day for 1 week, then 3 tablets/day for 1 week, then 2 tablets/day for 1 week. Then follow up. 70 tablet 0   APIXABAN (ELIQUIS) VTE STARTER PACK (10MG  AND 5MG ) Take as directed on package: start with two-5mg  tablets twice daily for 7 days. On day 8, switch to one-5mg  tablet twice daily. 74 each 0   aspirin EC 81 MG tablet Take 1 tablet by mouth daily.     atorvastatin (LIPITOR) 40 MG tablet Take 40 mg by mouth daily.     Omega-3 1000 MG CAPS Take 2 capsules by mouth daily.     No current facility-administered medications for this visit.    Allergies:   Patient has no known allergies.    Social History:   reports that he has never smoked. He has never used smokeless tobacco. He reports that he does not drink alcohol and does not use drugs.   Family History:  family history is not on file.    ROS:     Review of Systems  Constitutional: Negative.   HENT: Negative.    Eyes: Negative.   Respiratory: Negative.    Gastrointestinal: Negative.   Genitourinary: Negative.   Musculoskeletal: Negative.   Skin: Negative.   Neurological: Negative.   Endo/Heme/Allergies: Negative.   Psychiatric/Behavioral: Negative.    All other systems reviewed and are negative.      All other systems are reviewed and negative.    PHYSICAL EXAM: VS:  BP 120/75   Pulse 80   Ht 6' (1.829 m)   Wt 165 lb 3.2 oz (74.9 kg)   SpO2 95%   BMI 22.41 kg/m  , BMI Body mass index is 22.41 kg/m. Last weight:  Wt Readings from Last 3 Encounters:  02/28/23 165 lb 3.2 oz (74.9 kg)  02/25/23 168 lb 3.2 oz (76.3 kg)  10/31/21 163 lb (73.9 kg)     Physical Exam Vitals reviewed.  Constitutional:      Appearance: Normal appearance. He is normal weight.  HENT:     Head: Normocephalic.     Nose: Nose normal.     Mouth/Throat:     Mouth: Mucous membranes are moist.  Eyes:     Pupils: Pupils are equal, round, and reactive to light.  Cardiovascular:     Rate and Rhythm: Normal rate and regular rhythm.     Pulses: Normal pulses.     Heart sounds: Normal heart sounds.  Pulmonary:     Effort: Pulmonary effort is normal.  Abdominal:     General: Abdomen is flat. Bowel sounds are normal.  Musculoskeletal:        General: Normal range of  motion.     Cervical back: Normal range of motion.  Skin:    General: Skin is warm.  Neurological:     General: No focal deficit present.     Mental Status: He is alert.  Psychiatric:        Mood and Affect: Mood normal.       EKG: Afib 104/min RBBB, non-specific st changes  Recent Labs: No results found for requested labs within last 365 days.    Lipid Panel No results found for: "CHOL", "TRIG", "HDL", "CHOLHDL", "VLDL", "LDLCALC", "LDLDIRECT"    Other studies Reviewed: Additional studies/ records that were reviewed today include:  Review of the above records demonstrates:       No data to display            ASSESSMENT AND PLAN:    ICD-10-CM   1. Atrial fibrillation, currently in sinus rhythm  Z86.79 MYOCARDIAL PERFUSION IMAGING    PCV ECHOCARDIOGRAM COMPLETE   Has new onset atrial fibrilation, already on elliquis, will place on amiodrone 400  po bid loading dose, continue ELLIQUIS, GET ECHO STRESS TEST     2. Mixed hyperlipidemia  E78.2 MYOCARDIAL PERFUSION IMAGING    PCV ECHOCARDIOGRAM COMPLETE    3. Coronary artery disease involving native coronary artery of native heart without angina pectoris  I25.10 MYOCARDIAL PERFUSION IMAGING    PCV ECHOCARDIOGRAM COMPLETE   h/o PCI/stenting 1994    4. SOB (shortness of breath)  R06.02 MYOCARDIAL PERFUSION IMAGING    PCV ECHOCARDIOGRAM COMPLETE       Problem List Items Addressed This Visit       Other   Mixed hyperlipidemia   Relevant Medications   amiodarone (PACERONE) 200 MG tablet   Other Relevant Orders   MYOCARDIAL PERFUSION IMAGING   PCV ECHOCARDIOGRAM COMPLETE   Other Visit Diagnoses     Atrial fibrillation, currently in sinus rhythm    -  Primary   Has new onset atrial fibrilation, already on elliquis, will place on amiodrone 400  po bid loading dose, continue ELLIQUIS, GET ECHO STRESS TEST   Relevant Orders   MYOCARDIAL PERFUSION IMAGING   PCV ECHOCARDIOGRAM COMPLETE   Coronary artery disease involving native coronary artery of native heart without angina pectoris       h/o PCI/stenting 1994   Relevant Medications   amiodarone (PACERONE) 200 MG tablet   Other Relevant Orders   MYOCARDIAL PERFUSION IMAGING   PCV ECHOCARDIOGRAM COMPLETE   SOB (shortness of breath)       Relevant Orders   MYOCARDIAL PERFUSION IMAGING   PCV ECHOCARDIOGRAM COMPLETE          Disposition:   Return in about 2 weeks (around 03/14/2023) for echo, stress test and f/u.    Total time spent: 45 minutes  Signed,  Adrian Blackwater, MD  02/28/2023 11:36 AM    [facility]

## 2023-02-28 NOTE — Patient Instructions (Signed)
Take 4 tab daily amiodrone  for 1 week and 3 tab daily  for 1 week and then 2 tab daily for 1 week and then 1 tab daily. Also have echo and stress test done.

## 2023-03-08 ENCOUNTER — Ambulatory Visit (INDEPENDENT_AMBULATORY_CARE_PROVIDER_SITE_OTHER): Payer: Medicare PPO

## 2023-03-08 DIAGNOSIS — Z8679 Personal history of other diseases of the circulatory system: Secondary | ICD-10-CM

## 2023-03-08 DIAGNOSIS — I2510 Atherosclerotic heart disease of native coronary artery without angina pectoris: Secondary | ICD-10-CM

## 2023-03-08 DIAGNOSIS — E782 Mixed hyperlipidemia: Secondary | ICD-10-CM

## 2023-03-08 DIAGNOSIS — R0602 Shortness of breath: Secondary | ICD-10-CM | POA: Diagnosis not present

## 2023-03-08 MED ORDER — TECHNETIUM TC 99M SESTAMIBI GENERIC - CARDIOLITE
10.3000 | Freq: Once | INTRAVENOUS | Status: AC | PRN
  Administered 2023-03-08: 10.3 via INTRAVENOUS

## 2023-03-08 MED ORDER — TECHNETIUM TC 99M SESTAMIBI GENERIC - CARDIOLITE
31.4000 | Freq: Once | INTRAVENOUS | Status: AC | PRN
  Administered 2023-03-08: 31.4 via INTRAVENOUS

## 2023-03-10 ENCOUNTER — Ambulatory Visit (INDEPENDENT_AMBULATORY_CARE_PROVIDER_SITE_OTHER): Payer: Medicare PPO

## 2023-03-10 DIAGNOSIS — I340 Nonrheumatic mitral (valve) insufficiency: Secondary | ICD-10-CM

## 2023-03-10 DIAGNOSIS — I2510 Atherosclerotic heart disease of native coronary artery without angina pectoris: Secondary | ICD-10-CM

## 2023-03-10 DIAGNOSIS — I351 Nonrheumatic aortic (valve) insufficiency: Secondary | ICD-10-CM

## 2023-03-10 DIAGNOSIS — E782 Mixed hyperlipidemia: Secondary | ICD-10-CM

## 2023-03-10 DIAGNOSIS — Z8679 Personal history of other diseases of the circulatory system: Secondary | ICD-10-CM

## 2023-03-10 DIAGNOSIS — R0602 Shortness of breath: Secondary | ICD-10-CM

## 2023-03-15 ENCOUNTER — Encounter: Payer: Self-pay | Admitting: Cardiovascular Disease

## 2023-03-15 ENCOUNTER — Ambulatory Visit (INDEPENDENT_AMBULATORY_CARE_PROVIDER_SITE_OTHER): Payer: Medicare PPO | Admitting: Cardiovascular Disease

## 2023-03-15 VITALS — BP 110/69 | HR 85 | Ht 72.0 in | Wt 164.4 lb

## 2023-03-15 DIAGNOSIS — I2510 Atherosclerotic heart disease of native coronary artery without angina pectoris: Secondary | ICD-10-CM

## 2023-03-15 DIAGNOSIS — I340 Nonrheumatic mitral (valve) insufficiency: Secondary | ICD-10-CM

## 2023-03-15 DIAGNOSIS — R0602 Shortness of breath: Secondary | ICD-10-CM | POA: Diagnosis not present

## 2023-03-15 DIAGNOSIS — I4891 Unspecified atrial fibrillation: Secondary | ICD-10-CM

## 2023-03-15 DIAGNOSIS — E782 Mixed hyperlipidemia: Secondary | ICD-10-CM

## 2023-03-15 NOTE — Progress Notes (Signed)
Cardiology Office Note   Date:  03/15/2023   ID:  KRISTIN LAMAGNA, DOB 03-Aug-1934, MRN [id number]  PCP:  [name], MD  Cardiologist:  Adrian Blackwater, MD      History of Present Illness: Mark Barber is a 89 y.o. male who presents for  Chief Complaint  Patient presents with   Follow-up    NST & ECHO results    Doing well      History reviewed. No pertinent past medical history.   Past Surgical History:  Procedure Laterality Date   CORONARY ANGIOPLASTY WITH STENT PLACEMENT  1994   and 1997   TOTAL HIP ARTHROPLASTY Left 1994     Current Outpatient Medications  Medication Sig Dispense Refill   amiodarone (PACERONE) 200 MG tablet Take 4 tablets/day for 1 week, then 3 tablets/day for 1 week, then 2 tablets/day for 1 week. Then follow up. 70 tablet 0   APIXABAN (ELIQUIS) VTE STARTER PACK (10MG  AND 5MG ) Take as directed on package: start with two-5mg  tablets twice daily for 7 days. On day 8, switch to one-5mg  tablet twice daily. 74 each 0   aspirin EC 81 MG tablet Take 1 tablet by mouth daily.     atorvastatin (LIPITOR) 40 MG tablet Take 40 mg by mouth daily.     Omega-3 1000 MG CAPS Take 2 capsules by mouth daily.     No current facility-administered medications for this visit.    Allergies:   Patient has no known allergies.    Social History:   reports that he has never smoked. He has never used smokeless tobacco. He reports that he does not drink alcohol and does not use drugs.   Family History:  family history is not on file.    ROS:     Review of Systems  Constitutional: Negative.   HENT: Negative.    Eyes: Negative.   Respiratory: Negative.    Gastrointestinal: Negative.   Genitourinary: Negative.   Musculoskeletal: Negative.   Skin: Negative.   Neurological: Negative.   Endo/Heme/Allergies: Negative.   Psychiatric/Behavioral: Negative.    All other systems reviewed and are negative.     All other systems are reviewed and negative.     PHYSICAL EXAM: VS:  BP 110/69   Pulse 85   Ht 6' (1.829 m)   Wt 164 lb 6.4 oz (74.6 kg)   SpO2 98%   BMI 22.30 kg/m  , BMI Body mass index is 22.3 kg/m. Last weight:  Wt Readings from Last 3 Encounters:  03/15/23 164 lb 6.4 oz (74.6 kg)  02/28/23 165 lb 3.2 oz (74.9 kg)  02/25/23 168 lb 3.2 oz (76.3 kg)     Physical Exam Vitals reviewed.  Constitutional:      Appearance: Normal appearance. He is normal weight.  HENT:     Head: Normocephalic.     Nose: Nose normal.     Mouth/Throat:     Mouth: Mucous membranes are moist.  Eyes:     Pupils: Pupils are equal, round, and reactive to light.  Cardiovascular:     Rate and Rhythm: Normal rate and regular rhythm.     Pulses: Normal pulses.     Heart sounds: Normal heart sounds.  Pulmonary:     Effort: Pulmonary effort is normal.  Abdominal:     General: Abdomen is flat. Bowel sounds are normal.  Musculoskeletal:        General: Normal range of motion.     Cervical  back: Normal range of motion.  Skin:    General: Skin is warm.  Neurological:     General: No focal deficit present.     Mental Status: He is alert.  Psychiatric:        Mood and Affect: Mood normal.       EKG:   Recent Labs: No results found for requested labs within last 365 days.    Lipid Panel No results found for: "CHOL", "TRIG", "HDL", "CHOLHDL", "VLDL", "LDLCALC", "LDLDIRECT"    Other studies Reviewed: Additional studies/ records that were reviewed today include:  Review of the above records demonstrates:       No data to display            ASSESSMENT AND PLAN:    ICD-10-CM   1. Mixed hyperlipidemia  E78.2     2. Coronary artery disease involving native coronary artery of native heart without angina pectoris  I25.10    stress test normal    3. SOB (shortness of breath)  R06.02     4. Nonrheumatic mitral valve regurgitation  I34.0    moderate, MR with 71% LVEF on echo.    5. Atrial fibrillation, unspecified type (HCC)   I48.91    still in afib       Problem List Items Addressed This Visit       Other   Mixed hyperlipidemia - Primary   Other Visit Diagnoses     Coronary artery disease involving native coronary artery of native heart without angina pectoris       stress test normal   SOB (shortness of breath)       Nonrheumatic mitral valve regurgitation       moderate, MR with 71% LVEF on echo.   Atrial fibrillation, unspecified type (HCC)       still in afib          Disposition:   Return in about 4 weeks (around 04/12/2023).    Total time spent: 30 minutes  Signed,  Adrian Blackwater, MD  03/15/2023 9:55 AM    [facility]

## 2023-03-18 ENCOUNTER — Other Ambulatory Visit: Payer: Self-pay | Admitting: Cardiovascular Disease

## 2023-03-22 ENCOUNTER — Ambulatory Visit (INDEPENDENT_AMBULATORY_CARE_PROVIDER_SITE_OTHER): Payer: Medicare PPO | Admitting: Internal Medicine

## 2023-03-22 VITALS — BP 130/80 | HR 70 | Ht 72.0 in | Wt 165.2 lb

## 2023-03-22 DIAGNOSIS — Z8679 Personal history of other diseases of the circulatory system: Secondary | ICD-10-CM

## 2023-03-22 DIAGNOSIS — I2510 Atherosclerotic heart disease of native coronary artery without angina pectoris: Secondary | ICD-10-CM | POA: Diagnosis not present

## 2023-03-22 DIAGNOSIS — E782 Mixed hyperlipidemia: Secondary | ICD-10-CM | POA: Diagnosis not present

## 2023-03-22 NOTE — Progress Notes (Signed)
Established Patient Office Visit  Subjective:  Patient ID: Mark Barber, male    DOB: Jul 15, 1934  Age: 89 y.o. MRN: [id number]  Chief Complaint  Patient presents with   Follow-up    3 MO F/U    No new complaints, started on amio by cardiology, remains in afib with cvr and w/u negative so far. Denies any bleeding since started on apixaban.    No other concerns at this time.   No past medical history on file.  Past Surgical History:  Procedure Laterality Date   CORONARY ANGIOPLASTY WITH STENT PLACEMENT  1994   and 1997   TOTAL HIP ARTHROPLASTY Left 1994    Social History   Socioeconomic History   Marital status: Married    Spouse name: Not on file   Number of children: Not on file   Years of education: Not on file   Highest education level: Not on file  Occupational History   Not on file  Tobacco Use   Smoking status: Never   Smokeless tobacco: Never  Substance and Sexual Activity   Alcohol use: Never   Drug use: Never   Sexual activity: Yes  Other Topics Concern   Not on file  Social History Narrative   Not on file   Social Determinants of Health   Financial Resource Strain: Not on file  Food Insecurity: Not on file  Transportation Needs: Not on file  Physical Activity: Not on file  Stress: Not on file  Social Connections: Not on file  Intimate Partner Violence: Not on file    No family history on file.  No Known Allergies  Review of Systems  Constitutional: Negative.   HENT: Negative.    Eyes: Negative.   Respiratory: Negative.    Cardiovascular: Negative.   Gastrointestinal: Negative.   Genitourinary: Negative.   Skin: Negative.   Neurological: Negative.   Endo/Heme/Allergies: Negative.        Objective:   BP 130/80   Pulse 70   Ht 6' (1.829 m)   Wt 165 lb 3.2 oz (74.9 kg)   SpO2 96%   BMI 22.41 kg/m   Vitals:   03/22/23 1351  BP: 130/80  Pulse: 70  Height: 6' (1.829 m)  Weight: 165 lb 3.2 oz (74.9 kg)  SpO2: 96%  BMI  (Calculated): 22.4    Physical Exam Vitals reviewed.  Constitutional:      Appearance: Normal appearance.  HENT:     Head: Normocephalic.     Left Ear: There is no impacted cerumen.     Nose: Nose normal.     Mouth/Throat:     Mouth: Mucous membranes are moist.     Pharynx: No posterior oropharyngeal erythema.  Eyes:     Extraocular Movements: Extraocular movements intact.     Pupils: Pupils are equal, round, and reactive to light.  Cardiovascular:     Rate and Rhythm: Rhythm irregularly irregular.     Chest Wall: PMI is not displaced.     Pulses: Normal pulses.     Heart sounds: Normal heart sounds. No murmur heard. Pulmonary:     Effort: Pulmonary effort is normal.     Breath sounds: Normal air entry. No rhonchi or rales.  Abdominal:     General: Abdomen is flat. Bowel sounds are normal. There is no distension.     Palpations: Abdomen is soft. There is no hepatomegaly, splenomegaly or mass.     Tenderness: There is no abdominal tenderness.  Musculoskeletal:  General: Normal range of motion.     Cervical back: Normal range of motion and neck supple.     Right knee: Deformity present.     Left knee: Deformity present.     Right lower leg: No edema.     Left lower leg: No edema.  Skin:    General: Skin is warm and dry.  Neurological:     General: No focal deficit present.     Mental Status: He is alert and oriented to person, place, and time.     Cranial Nerves: No cranial nerve deficit.     Motor: No weakness.  Psychiatric:        Mood and Affect: Mood normal.        Behavior: Behavior normal.      No results found for any visits on 03/22/23.  No results found for this or any previous visit (from the past 2160 hour(Alegandro Macnaughton)).    Assessment & Plan:  As per problem list.  Problem List Items Addressed This Visit       Other   Mixed hyperlipidemia   Relevant Orders   Lipid panel   Other Visit Diagnoses     Atrial fibrillation, currently in sinus rhythm     -  Primary   Relevant Orders   CBC With Diff/Platelet   Comprehensive metabolic panel   Coronary artery disease involving native coronary artery of native heart without angina pectoris       Relevant Orders   CBC With Diff/Platelet   Lipid panel   Comprehensive metabolic panel       Return in about 9 weeks (around 05/24/2023) for fu with labs prior.   Total time spent: 20 minutes  Luna Fuse, MD  03/22/2023   This document may have been prepared by [facility] Voice Recognition software and as such may include unintentional dictation errors.

## 2023-04-02 IMAGING — CR DG HIP (WITH OR WITHOUT PELVIS) 2-3V*L*
1 series · 3 of 3 positions shown · non-contrast
Comparison: None.

CLINICAL DATA: Left hip pain for 1 week after injury.

EXAM:
DG HIP (WITH OR WITHOUT PELVIS) 2-3V LEFT

[Series 1: dg hip unilat w or w/o pelvis 2-3 views  · non-contrast · 0.14mm/px · 3 of 3 slices shown]
[im 1/3]
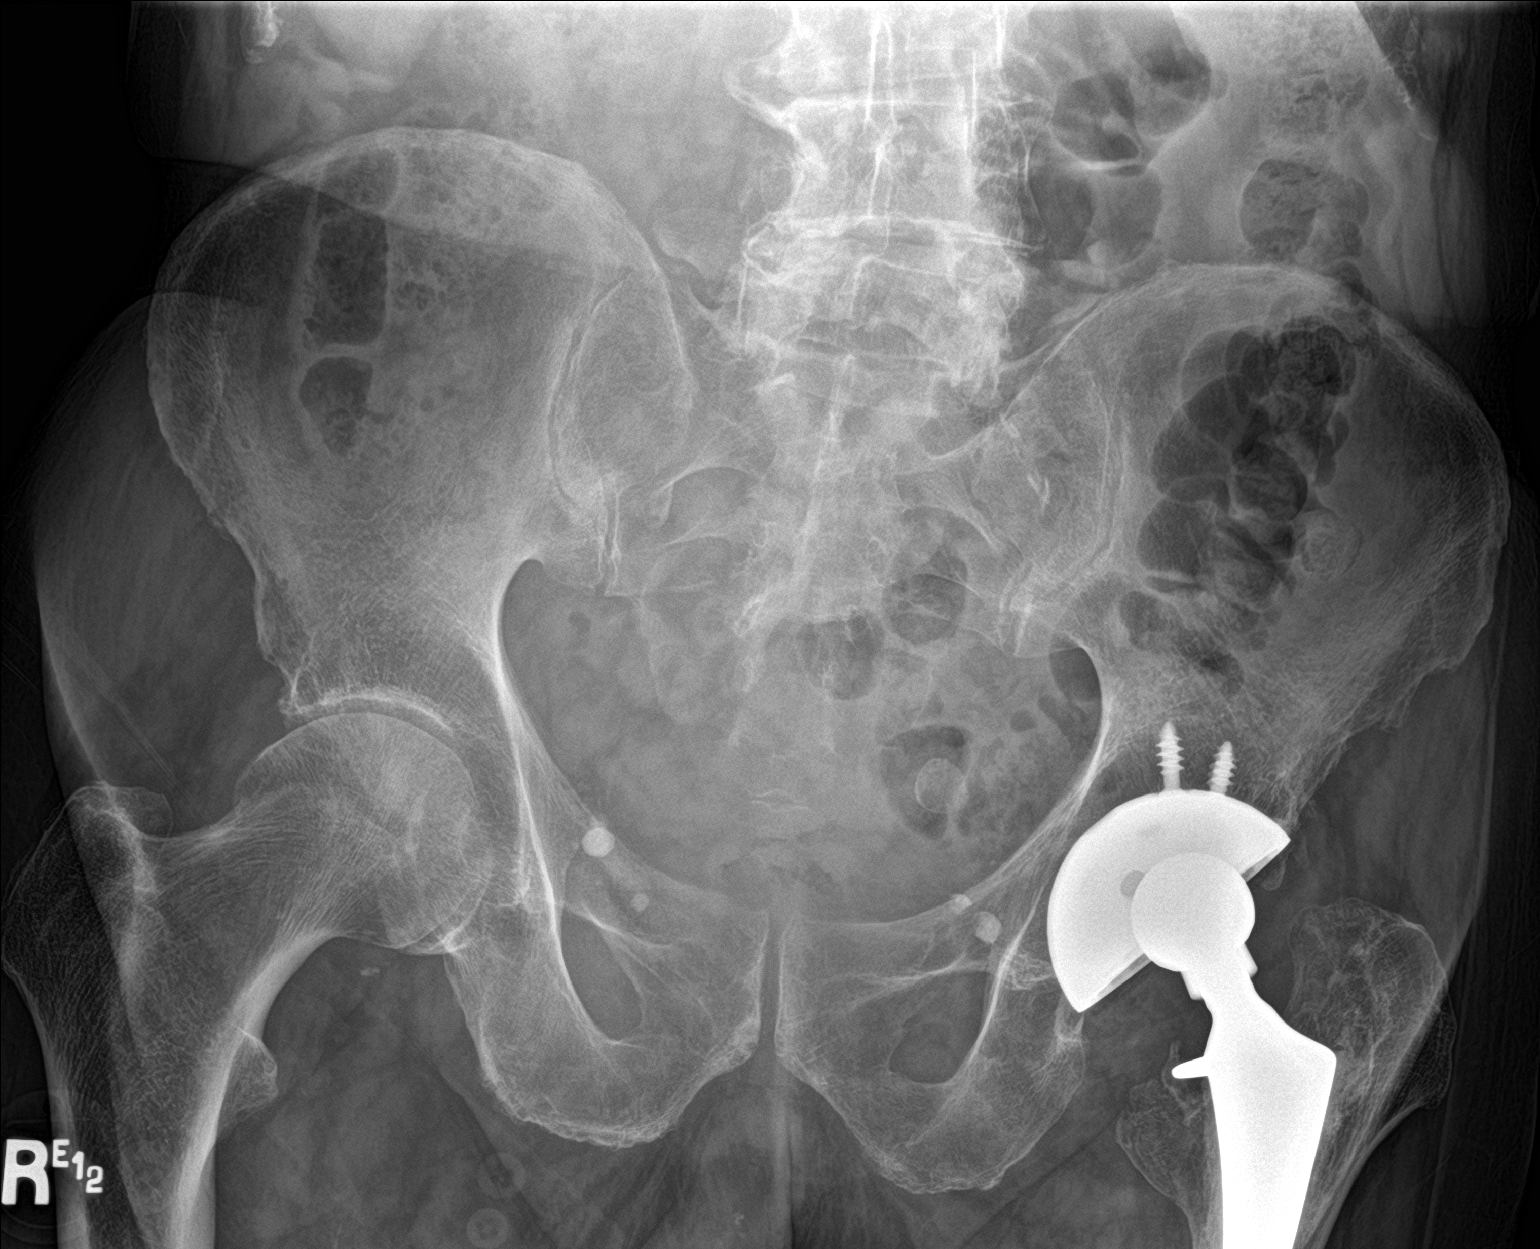
[im 2/3]
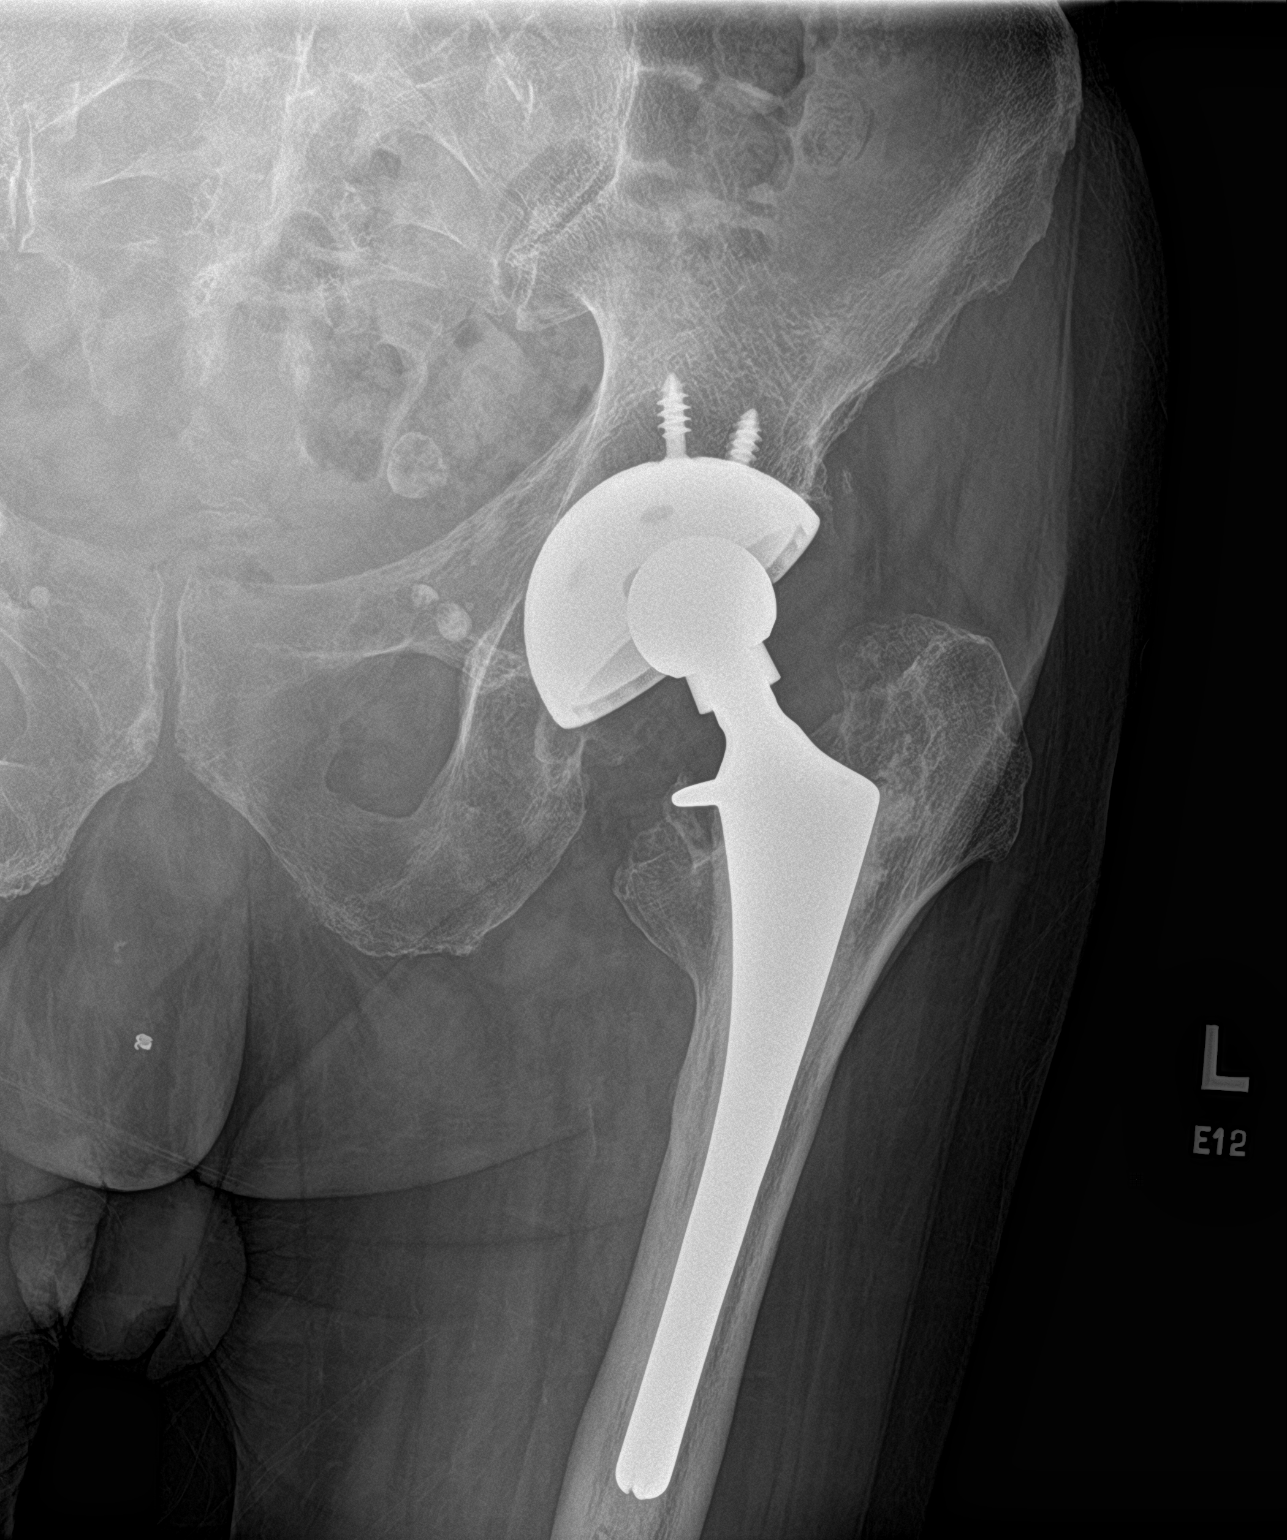
[im 3/3]
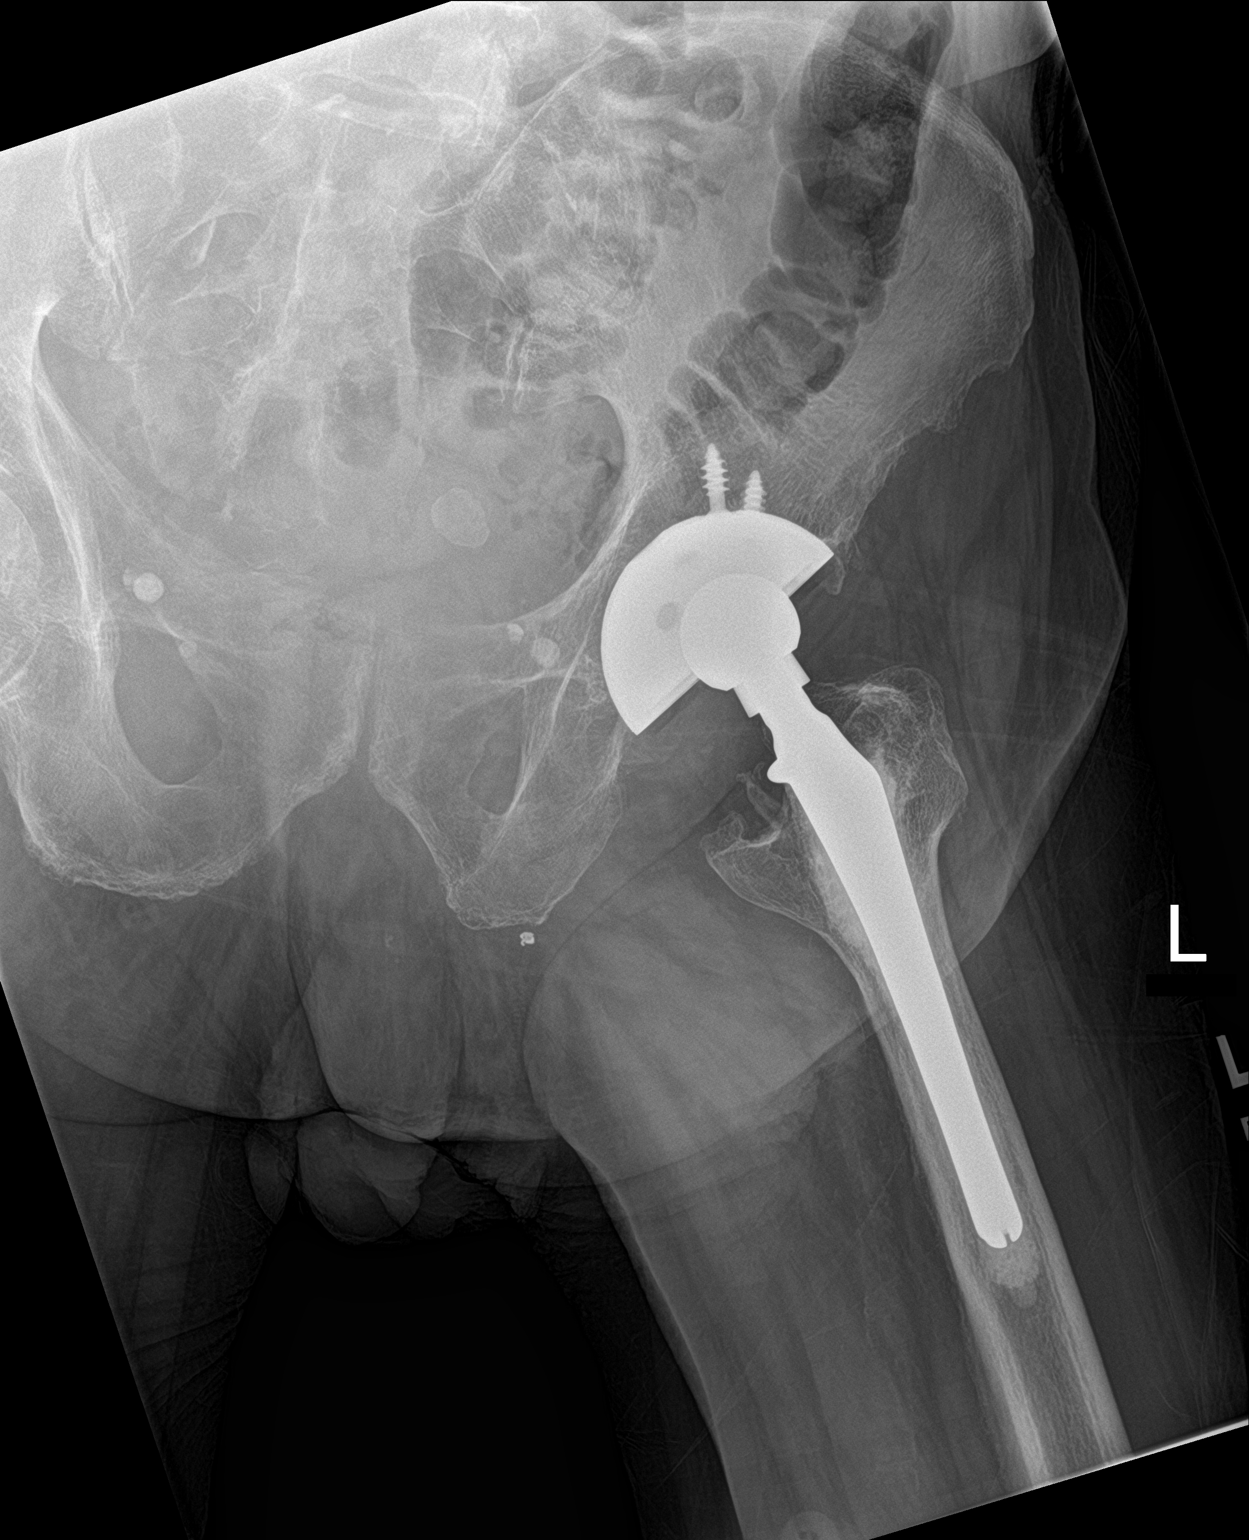

[3 of 3 positions shown; findings below may reference images not displayed]

FINDINGS: Status post left total hip arthroplasty. No fracture or dislocation
is noted.
IMPRESSION: No acute abnormality is noted.

## 2023-04-06 ENCOUNTER — Other Ambulatory Visit: Payer: Self-pay | Admitting: Cardiovascular Disease

## 2023-04-12 DIAGNOSIS — G250 Essential tremor: Secondary | ICD-10-CM | POA: Diagnosis not present

## 2023-04-19 ENCOUNTER — Encounter: Payer: Self-pay | Admitting: Cardiovascular Disease

## 2023-04-19 ENCOUNTER — Ambulatory Visit: Payer: Medicare PPO | Admitting: Cardiovascular Disease

## 2023-04-19 VITALS — BP 130/75 | HR 97 | Ht 72.0 in | Wt 164.0 lb

## 2023-04-19 DIAGNOSIS — I2510 Atherosclerotic heart disease of native coronary artery without angina pectoris: Secondary | ICD-10-CM | POA: Diagnosis not present

## 2023-04-19 DIAGNOSIS — Z8679 Personal history of other diseases of the circulatory system: Secondary | ICD-10-CM | POA: Diagnosis not present

## 2023-04-19 DIAGNOSIS — I340 Nonrheumatic mitral (valve) insufficiency: Secondary | ICD-10-CM

## 2023-04-19 DIAGNOSIS — E782 Mixed hyperlipidemia: Secondary | ICD-10-CM

## 2023-04-19 MED ORDER — AMIODARONE HCL 200 MG PO TABS: ORAL_TABLET | ORAL | 0 refills | Status: DC

## 2023-04-19 NOTE — Progress Notes (Signed)
Cardiology Office Note   Date:  04/19/2023   ID:  Mark Barber, DOB [birth year], MRN [id number]  PCP:  [name], MD  Cardiologist:  Adrian Blackwater, MD      History of Present Illness: Mark Barber is a 89 y.o. male who presents for  Chief Complaint  Patient presents with   Follow-up    1 MO    HPI    History reviewed. No pertinent past medical history.   Past Surgical History:  Procedure Laterality Date   CORONARY ANGIOPLASTY WITH STENT PLACEMENT  1994   and 1997   TOTAL HIP ARTHROPLASTY Left 1994     Current Outpatient Medications  Medication Sig Dispense Refill   amiodarone (PACERONE) 200 MG tablet TAKE 4 TABS A DAY X1 WEEK, 3 TABS A DAY X1 WEEK, THEN 2 TABS A DAY X1 WEEK. THEN FOLLOW UP 70 tablet 0   APIXABAN (ELIQUIS) VTE STARTER PACK (10MG  AND 5MG ) Take as directed on package: start with two-5mg  tablets twice daily for 7 days. On day 8, switch to one-5mg  tablet twice daily. 74 each 0   aspirin EC 81 MG tablet Take 1 tablet by mouth daily.     atorvastatin (LIPITOR) 40 MG tablet Take 40 mg by mouth daily.     Omega-3 1000 MG CAPS Take 2 capsules by mouth daily.     No current facility-administered medications for this visit.    Allergies:   Patient has no known allergies.    Social History:   reports that he has never smoked. He has never used smokeless tobacco. He reports that he does not drink alcohol and does not use drugs.   Family History:  family history is not on file.    ROS:     ROS    All other systems are reviewed and negative.    PHYSICAL EXAM: VS:  BP 130/75   Pulse 97   Ht 6' (1.829 m)   Wt 164 lb (74.4 kg)   SpO2 92%   BMI 22.24 kg/m  , BMI Body mass index is 22.24 kg/m. Last weight:  Wt Readings from Last 3 Encounters:  04/19/23 164 lb (74.4 kg)  03/22/23 165 lb 3.2 oz (74.9 kg)  03/15/23 164 lb 6.4 oz (74.6 kg)     Physical Exam    EKG:   Recent Labs: No results found for requested labs within last 365  days.    Lipid Panel No results found for: "CHOL", "TRIG", "HDL", "CHOLHDL", "VLDL", "LDLCALC", "LDLDIRECT"    Other studies Reviewed: Additional studies/ records that were reviewed today include:  Review of the above records demonstrates:       No data to display            ASSESSMENT AND PLAN:    ICD-10-CM   1. Atrial fibrillation, currently in sinus rhythm  Z86.79 amiodarone (PACERONE) 200 MG tablet   patient still in afib, thus will go back to amiodrone 400 bid, if fails this time will change to rate control with metoprolol.    2. Coronary artery disease involving native coronary artery of native heart without angina pectoris  I25.10     3. Mixed hyperlipidemia  E78.2     4. Nonrheumatic mitral valve regurgitation  I34.0        Problem List Items Addressed This Visit       Other   Mixed hyperlipidemia   Relevant Medications   amiodarone (PACERONE) 200 MG tablet  Other Visit Diagnoses     Atrial fibrillation, currently in sinus rhythm    -  Primary   patient still in afib, thus will go back to amiodrone 400 bid, if fails this time will change to rate control with metoprolol.   Relevant Medications   amiodarone (PACERONE) 200 MG tablet   Coronary artery disease involving native coronary artery of native heart without angina pectoris       Relevant Medications   amiodarone (PACERONE) 200 MG tablet   Nonrheumatic mitral valve regurgitation       Relevant Medications   amiodarone (PACERONE) 200 MG tablet          Disposition:   Return in about 3 weeks (around 05/10/2023).    Total time spent: 35 minutes  Signed,  Adrian Blackwater, MD  04/19/2023 10:52 AM    [facility]

## 2023-04-21 ENCOUNTER — Other Ambulatory Visit: Payer: Self-pay | Admitting: Cardiovascular Disease

## 2023-04-21 DIAGNOSIS — Z8679 Personal history of other diseases of the circulatory system: Secondary | ICD-10-CM

## 2023-05-10 ENCOUNTER — Encounter: Payer: Self-pay | Admitting: Cardiovascular Disease

## 2023-05-10 ENCOUNTER — Ambulatory Visit (INDEPENDENT_AMBULATORY_CARE_PROVIDER_SITE_OTHER): Payer: Medicare PPO | Admitting: Cardiovascular Disease

## 2023-05-10 VITALS — BP 110/69 | HR 52 | Ht 72.0 in | Wt 162.4 lb

## 2023-05-10 DIAGNOSIS — E782 Mixed hyperlipidemia: Secondary | ICD-10-CM

## 2023-05-10 DIAGNOSIS — I2510 Atherosclerotic heart disease of native coronary artery without angina pectoris: Secondary | ICD-10-CM | POA: Diagnosis not present

## 2023-05-10 DIAGNOSIS — I482 Chronic atrial fibrillation, unspecified: Secondary | ICD-10-CM | POA: Diagnosis not present

## 2023-05-10 DIAGNOSIS — I340 Nonrheumatic mitral (valve) insufficiency: Secondary | ICD-10-CM | POA: Diagnosis not present

## 2023-05-10 MED ORDER — METOPROLOL SUCCINATE ER 25 MG PO TB24: 25.0000 mg | ORAL_TABLET | Freq: Every day | ORAL | 11 refills | Status: DC

## 2023-05-10 MED ORDER — ATORVASTATIN CALCIUM 40 MG PO TABS: 40.0000 mg | ORAL_TABLET | Freq: Every day | ORAL | 2 refills | Status: DC

## 2023-05-10 NOTE — Progress Notes (Signed)
Cardiology Office Note   Date:  05/10/2023   ID:  Mark Barber, DOB [birth year], MRN [id number]  PCP:  [name], MD  Cardiologist:  Adrian Blackwater, MD      History of Present Illness: Mark Barber is a 89 y.o. male who presents for  Chief Complaint  Patient presents with   Follow-up    Feeling fine      History reviewed. No pertinent past medical history.   Past Surgical History:  Procedure Laterality Date   CORONARY ANGIOPLASTY WITH STENT PLACEMENT  1994   and 1997   TOTAL HIP ARTHROPLASTY Left 1994     Current Outpatient Medications  Medication Sig Dispense Refill   metoprolol succinate (TOPROL XL) 25 MG 24 hr tablet Take 1 tablet (25 mg total) by mouth daily. 30 tablet 11   APIXABAN (ELIQUIS) VTE STARTER PACK (10MG  AND 5MG ) Take as directed on package: start with two-5mg  tablets twice daily for 7 days. On day 8, switch to one-5mg  tablet twice daily. 74 each 0   aspirin EC 81 MG tablet Take 1 tablet by mouth daily.     atorvastatin (LIPITOR) 40 MG tablet Take 1 tablet (40 mg total) by mouth daily. 90 tablet 2   Omega-3 1000 MG CAPS Take 2 capsules by mouth daily.     No current facility-administered medications for this visit.    Allergies:   Patient has no known allergies.    Social History:   reports that he has never smoked. He has never used smokeless tobacco. He reports that he does not drink alcohol and does not use drugs.   Family History:  family history is not on file.    ROS:     Review of Systems  Constitutional: Negative.   HENT: Negative.    Eyes: Negative.   Respiratory: Negative.    Gastrointestinal: Negative.   Genitourinary: Negative.   Musculoskeletal: Negative.   Skin: Negative.   Neurological: Negative.   Endo/Heme/Allergies: Negative.   Psychiatric/Behavioral: Negative.    All other systems reviewed and are negative.     All other systems are reviewed and negative.    PHYSICAL EXAM: VS:  BP 110/69   Pulse (!)  52   Wt 162 lb 6.4 oz (73.7 kg)   SpO2 97%   BMI 22.03 kg/m  , BMI Body mass index is 22.03 kg/m. Last weight:  Wt Readings from Last 3 Encounters:  05/10/23 162 lb 6.4 oz (73.7 kg)  04/19/23 164 lb (74.4 kg)  03/22/23 165 lb 3.2 oz (74.9 kg)     Physical Exam Vitals reviewed.  Constitutional:      Appearance: Normal appearance. He is normal weight.  HENT:     Head: Normocephalic.     Nose: Nose normal.     Mouth/Throat:     Mouth: Mucous membranes are moist.  Eyes:     Pupils: Pupils are equal, round, and reactive to light.  Cardiovascular:     Rate and Rhythm: Normal rate and regular rhythm.     Pulses: Normal pulses.     Heart sounds: Normal heart sounds.  Pulmonary:     Effort: Pulmonary effort is normal.  Abdominal:     General: Abdomen is flat. Bowel sounds are normal.  Musculoskeletal:        General: Normal range of motion.     Cervical back: Normal range of motion.  Skin:    General: Skin is warm.  Neurological:  General: No focal deficit present.     Mental Status: He is alert.  Psychiatric:        Mood and Affect: Mood normal.       EKG: Afib with 95/min, non-specific st changes  Recent Labs: No results found for requested labs within last 365 days.    Lipid Panel No results found for: "CHOL", "TRIG", "HDL", "CHOLHDL", "VLDL", "LDLCALC", "LDLDIRECT"    Other studies Reviewed: Additional studies/ records that were reviewed today include:  Review of the above records demonstrates:       No data to display            ASSESSMENT AND PLAN:    ICD-10-CM   1. Mixed hyperlipidemia  E78.2 atorvastatin (LIPITOR) 40 MG tablet    metoprolol succinate (TOPROL XL) 25 MG 24 hr tablet    2. Nonrheumatic mitral valve regurgitation  I34.0 atorvastatin (LIPITOR) 40 MG tablet    metoprolol succinate (TOPROL XL) 25 MG 24 hr tablet    3. Coronary artery disease involving native coronary artery of native heart without angina pectoris  I25.10  atorvastatin (LIPITOR) 40 MG tablet    metoprolol succinate (TOPROL XL) 25 MG 24 hr tablet    4. Chronic atrial fibrillation (HCC)  I48.20 atorvastatin (LIPITOR) 40 MG tablet    metoprolol succinate (TOPROL XL) 25 MG 24 hr tablet   Patient is not converting to NSR with reloading of amiodrone. Since still in afib. So will stop amiodrone and dop rate control with metoprolol as asymptomatic.       Problem List Items Addressed This Visit       Other   Mixed hyperlipidemia - Primary   Relevant Medications   atorvastatin (LIPITOR) 40 MG tablet   metoprolol succinate (TOPROL XL) 25 MG 24 hr tablet   Other Visit Diagnoses     Nonrheumatic mitral valve regurgitation       Relevant Medications   atorvastatin (LIPITOR) 40 MG tablet   metoprolol succinate (TOPROL XL) 25 MG 24 hr tablet   Coronary artery disease involving native coronary artery of native heart without angina pectoris       Relevant Medications   atorvastatin (LIPITOR) 40 MG tablet   metoprolol succinate (TOPROL XL) 25 MG 24 hr tablet   Chronic atrial fibrillation (HCC)       Patient is not converting to NSR with reloading of amiodrone. Since still in afib. So will stop amiodrone and dop rate control with metoprolol as asymptomatic.   Relevant Medications   atorvastatin (LIPITOR) 40 MG tablet   metoprolol succinate (TOPROL XL) 25 MG 24 hr tablet          Disposition:   Return in about 4 weeks (around 06/07/2023).    Total time spent: 30 minutes  Signed,  Adrian Blackwater, MD  05/10/2023 9:52 AM    [facility]

## 2023-05-12 ENCOUNTER — Encounter: Payer: Self-pay | Admitting: Cardiovascular Disease

## 2023-05-23 ENCOUNTER — Ambulatory Visit: Payer: Medicare PPO | Admitting: Internal Medicine

## 2023-05-23 ENCOUNTER — Other Ambulatory Visit: Payer: Medicare PPO

## 2023-05-23 ENCOUNTER — Encounter: Payer: Self-pay | Admitting: Internal Medicine

## 2023-05-23 ENCOUNTER — Other Ambulatory Visit: Payer: Self-pay | Admitting: Internal Medicine

## 2023-05-23 VITALS — BP 140/80 | HR 56 | Ht 72.0 in | Wt 163.0 lb

## 2023-05-23 DIAGNOSIS — R55 Syncope and collapse: Secondary | ICD-10-CM | POA: Diagnosis not present

## 2023-05-23 DIAGNOSIS — E785 Hyperlipidemia, unspecified: Secondary | ICD-10-CM | POA: Diagnosis not present

## 2023-05-23 DIAGNOSIS — E749 Disorder of carbohydrate metabolism, unspecified: Secondary | ICD-10-CM | POA: Diagnosis not present

## 2023-05-23 DIAGNOSIS — D649 Anemia, unspecified: Secondary | ICD-10-CM | POA: Diagnosis not present

## 2023-05-23 DIAGNOSIS — I951 Orthostatic hypotension: Secondary | ICD-10-CM

## 2023-05-23 NOTE — Progress Notes (Signed)
Established Patient Office Visit  Subjective:  Patient ID: Mark Barber, male    DOB: 10-29-1933  Age: 89 y.o. MRN: [id number]  Chief Complaint  Patient presents with  . Follow-up    Wife stated she heard a loud bang coming from the kitchen and called pts names with no response, when she went in the kitchen he had fainted over the counter top, but didn't hit the floor, no injuries      Urgent visit for  a fall with preceding syncope, loc for about 1-2 minutes. Was standing and struck his head on the counter top, wife rushed to him and was able to slide him into a chair as he regained consciousness. No tonic clonic movements or sphincteric incontinence. He declined EMS evaluation. No chest pain or palpitations. No other concerns at this time.   No past medical history on file.  Past Surgical History:  Procedure Laterality Date  . CORONARY ANGIOPLASTY WITH STENT PLACEMENT  1994   and 1997  . TOTAL HIP ARTHROPLASTY Left 1994    Social History   Socioeconomic History  . Marital status: Married    Spouse name: Not on file  . Number of children: Not on file  . Years of education: Not on file  . Highest education level: Not on file  Occupational History  . Not on file  Tobacco Use  . Smoking status: Never  . Smokeless tobacco: Never  Substance and Sexual Activity  . Alcohol use: Never  . Drug use: Never  . Sexual activity: Yes  Other Topics Concern  . Not on file  Social History Narrative  . Not on file   Social Determinants of Health   Financial Resource Strain: Not on file  Food Insecurity: Not on file  Transportation Needs: Not on file  Physical Activity: Not on file  Stress: Not on file  Social Connections: Not on file  Intimate Partner Violence: Not on file    No family history on file.  No Known Allergies  Review of Systems  Cardiovascular:  Negative for palpitations.  Gastrointestinal:  Negative for diarrhea, nausea and vomiting.  Neurological:   Negative for dizziness and headaches.  All other systems reviewed and are negative.      Objective:   BP (!) 140/80 (BP Location: Left Arm)   Pulse (!) 56   Ht 6' (1.829 m)   Wt 163 lb (73.9 kg)   SpO2 98%   BMI 22.11 kg/m   Vitals:   05/23/23 1309  BP: (!) 140/80  Pulse: (!) 56  Height: 6' (1.829 m)  Weight: 163 lb (73.9 kg)  SpO2: 98%  BMI (Calculated): 22.1    Physical Exam Vitals reviewed.  Constitutional:      Appearance: Normal appearance. He is normal weight.  HENT:     Head: Normocephalic.     Nose: Nose normal.     Mouth/Throat:     Mouth: Mucous membranes are moist.  Eyes:     Pupils: Pupils are equal, round, and reactive to light.  Cardiovascular:     Rate and Rhythm: Normal rate and regular rhythm.     Pulses: Normal pulses.          Carotid pulses are  on the left side with bruit.    Heart sounds: Normal heart sounds.  Pulmonary:     Effort: Pulmonary effort is normal.  Abdominal:     General: Abdomen is flat. Bowel sounds are normal.  Musculoskeletal:  General: Normal range of motion.     Cervical back: Normal range of motion.  Skin:    General: Skin is warm.  Neurological:     General: No focal deficit present.     Mental Status: He is alert.  Psychiatric:        Mood and Affect: Mood normal.     No results found for any visits on 05/23/23.  No results found for this or any previous visit (from the past 2160 hour(Willadene Mounsey)).    Assessment & Plan:  As per problem list  Problem List Items Addressed This Visit       Cardiovascular and Mediastinum   Orthostatic hypotension     Other   Syncope and collapse - Primary    No follow-ups on file.   Total time spent: 30 minutes  Luna Fuse, MD  05/23/2023   This document may have been prepared by [facility] Voice Recognition software and as such may include unintentional dictation errors.

## 2023-05-24 LAB — CBC
Hematocrit: 40.1 % (ref 37.5–51.0)
Hemoglobin: 13.1 g/dL (ref 13.0–17.7)
MCH: 32.2 pg (ref 26.6–33.0)
MCHC: 32.7 g/dL (ref 31.5–35.7)
MCV: 99 fL — ABNORMAL HIGH (ref 79–97)
Platelets: 179 10*3/uL (ref 150–450)
RBC: 4.07 x10E6/uL — ABNORMAL LOW (ref 4.14–5.80)
RDW: 12.9 % (ref 11.6–15.4)
WBC: 7.1 10*3/uL (ref 3.4–10.8)

## 2023-05-24 LAB — HEPATIC FUNCTION PANEL
ALT: 18 IU/L (ref 0–44)
AST: 23 IU/L (ref 0–40)
Albumin: 4.3 g/dL (ref 3.7–4.7)
Alkaline Phosphatase: 97 IU/L (ref 44–121)
Bilirubin Total: 0.5 mg/dL (ref 0.0–1.2)
Bilirubin, Direct: 0.14 mg/dL (ref 0.00–0.40)
Total Protein: 6.6 g/dL (ref 6.0–8.5)

## 2023-05-24 LAB — LIPID PANEL W/O CHOL/HDL RATIO
Cholesterol, Total: 124 mg/dL (ref 100–199)
HDL: 46 mg/dL (ref >39)
LDL Chol Calc (NIH): 66 mg/dL (ref 0–99)
Triglycerides: 50 mg/dL (ref 0–149)
VLDL Cholesterol Cal: 12 mg/dL (ref 5–40)

## 2023-05-24 LAB — HGB A1C W/O EAG: Hgb A1c MFr Bld: 6.4 % — ABNORMAL HIGH (ref 4.8–5.6)

## 2023-05-24 LAB — CK: Total CK: 58 U/L (ref 30–208)

## 2023-05-31 MED ORDER — SODIUM CHLORIDE 0.9 % IV SOLN: Freq: Once | INTRAVENOUS | Status: AC

## 2023-06-13 ENCOUNTER — Encounter: Payer: Self-pay | Admitting: Cardiovascular Disease

## 2023-06-13 ENCOUNTER — Ambulatory Visit: Payer: Medicare PPO | Admitting: Cardiovascular Disease

## 2023-06-13 DIAGNOSIS — I482 Chronic atrial fibrillation, unspecified: Secondary | ICD-10-CM | POA: Diagnosis not present

## 2023-06-13 DIAGNOSIS — I2510 Atherosclerotic heart disease of native coronary artery without angina pectoris: Secondary | ICD-10-CM | POA: Diagnosis not present

## 2023-06-13 DIAGNOSIS — E782 Mixed hyperlipidemia: Secondary | ICD-10-CM

## 2023-06-13 DIAGNOSIS — I340 Nonrheumatic mitral (valve) insufficiency: Secondary | ICD-10-CM | POA: Diagnosis not present

## 2023-06-13 MED ORDER — METOPROLOL SUCCINATE ER 25 MG PO TB24: 25.0000 mg | ORAL_TABLET | Freq: Every day | ORAL | 11 refills | Status: DC

## 2023-06-13 NOTE — Patient Instructions (Signed)
STOP AMIODRONE AND START TAKING METOPROLOL 25 DAILY INSTEAD

## 2023-06-13 NOTE — Progress Notes (Signed)
Cardiology Office Note   Date:  06/13/2023   ID:  Mark Barber, DOB 05/02/1934, MRN [id number]  PCP:  [name], MD  Cardiologist:  Adrian Blackwater, MD      History of Present Illness: Mark Barber is a 89 y.o. male who presents for  Chief Complaint  Patient presents with   Follow-up    1 mo f/u    HPI    History reviewed. No pertinent past medical history.   Past Surgical History:  Procedure Laterality Date   CORONARY ANGIOPLASTY WITH STENT PLACEMENT  1994   and 1997   TOTAL HIP ARTHROPLASTY Left 1994     Current Outpatient Medications  Medication Sig Dispense Refill   APIXABAN (ELIQUIS) VTE STARTER PACK (10MG  AND 5MG ) Take as directed on package: start with two-5mg  tablets twice daily for 7 days. On day 8, switch to one-5mg  tablet twice daily. 74 each 0   aspirin EC 81 MG tablet Take 1 tablet by mouth daily.     atorvastatin (LIPITOR) 40 MG tablet Take 1 tablet (40 mg total) by mouth daily. 90 tablet 2   metoprolol succinate (TOPROL XL) 25 MG 24 hr tablet Take 1 tablet (25 mg total) by mouth daily. 30 tablet 11   Omega-3 1000 MG CAPS Take 2 capsules by mouth daily.     Current Facility-Administered Medications  Medication Dose Route Frequency Provider Last Rate Last Admin   0.9 %  sodium chloride infusion   Intravenous Once         Allergies:   Patient has no known allergies.    Social History:   reports that he has never smoked. He has never used smokeless tobacco. He reports that he does not drink alcohol and does not use drugs.   Family History:  family history is not on file.    ROS:     Review of Systems  Constitutional: Negative.   HENT: Negative.    Eyes: Negative.   Respiratory: Negative.    Gastrointestinal: Negative.   Genitourinary: Negative.   Musculoskeletal: Negative.   Skin: Negative.   Neurological: Negative.   Endo/Heme/Allergies: Negative.   Psychiatric/Behavioral: Negative.    All other systems reviewed and are  negative.     All other systems are reviewed and negative.    PHYSICAL EXAM: VS:  BP 115/65   Pulse 66   Ht 6' (1.829 m)   Wt 162 lb 9.6 oz (73.8 kg)   SpO2 94%   BMI 22.05 kg/m  , BMI Body mass index is 22.05 kg/m. Last weight:  Wt Readings from Last 3 Encounters:  06/13/23 162 lb 9.6 oz (73.8 kg)  05/23/23 163 lb (73.9 kg)  05/10/23 162 lb 6.4 oz (73.7 kg)     Physical Exam Vitals reviewed.  Constitutional:      Appearance: Normal appearance. He is normal weight.  HENT:     Head: Normocephalic.     Nose: Nose normal.     Mouth/Throat:     Mouth: Mucous membranes are moist.  Eyes:     Pupils: Pupils are equal, round, and reactive to light.  Cardiovascular:     Rate and Rhythm: Normal rate and regular rhythm.     Pulses: Normal pulses.     Heart sounds: Normal heart sounds.  Pulmonary:     Effort: Pulmonary effort is normal.  Abdominal:     General: Abdomen is flat. Bowel sounds are normal.  Musculoskeletal:  General: Normal range of motion.     Cervical back: Normal range of motion.  Skin:    General: Skin is warm.  Neurological:     General: No focal deficit present.     Mental Status: He is alert.  Psychiatric:        Mood and Affect: Mood normal.       EKG:   Recent Labs: 05/23/2023: ALT 18; Hemoglobin 13.1; Platelets 179    Lipid Panel    Component Value Date/Time   CHOL 124 05/23/2023 0859   TRIG 50 05/23/2023 0859   HDL 46 05/23/2023 0859   LDLCALC 66 05/23/2023 0859      Other studies Reviewed: Additional studies/ records that were reviewed today include:  Review of the above records demonstrates:       No data to display            ASSESSMENT AND PLAN:    ICD-10-CM   1. Mixed hyperlipidemia  E78.2 metoprolol succinate (TOPROL XL) 25 MG 24 hr tablet    2. Nonrheumatic mitral valve regurgitation  I34.0 metoprolol succinate (TOPROL XL) 25 MG 24 hr tablet    3. Coronary artery disease involving native coronary  artery of native heart without angina pectoris  I25.10 metoprolol succinate (TOPROL XL) 25 MG 24 hr tablet    4. Chronic atrial fibrillation (HCC)  I48.20 metoprolol succinate (TOPROL XL) 25 MG 24 hr tablet   Patient is not converting to NSR with reloading of amiodrone. Since still in afib. So will stop amiodrone and dop rate control with metoprolol as asymptomatic.       Problem List Items Addressed This Visit       Other   Mixed hyperlipidemia   Relevant Medications   metoprolol succinate (TOPROL XL) 25 MG 24 hr tablet   Other Visit Diagnoses     Nonrheumatic mitral valve regurgitation       Relevant Medications   metoprolol succinate (TOPROL XL) 25 MG 24 hr tablet   Coronary artery disease involving native coronary artery of native heart without angina pectoris       Relevant Medications   metoprolol succinate (TOPROL XL) 25 MG 24 hr tablet   Chronic atrial fibrillation (HCC)       Patient is not converting to NSR with reloading of amiodrone. Since still in afib. So will stop amiodrone and dop rate control with metoprolol as asymptomatic.   Relevant Medications   metoprolol succinate (TOPROL XL) 25 MG 24 hr tablet          Disposition:   Return in about 6 weeks (around 07/25/2023).    Total time spent: 35 minutes  Signed,  Adrian Blackwater, MD  06/13/2023 10:03 AM    [facility]

## 2023-07-25 ENCOUNTER — Ambulatory Visit: Payer: Medicare PPO | Admitting: Cardiovascular Disease

## 2023-07-25 ENCOUNTER — Encounter: Payer: Self-pay | Admitting: Cardiovascular Disease

## 2023-07-25 VITALS — BP 118/74 | HR 77 | Ht 72.0 in | Wt 164.2 lb

## 2023-07-25 DIAGNOSIS — I2510 Atherosclerotic heart disease of native coronary artery without angina pectoris: Secondary | ICD-10-CM

## 2023-07-25 DIAGNOSIS — I482 Chronic atrial fibrillation, unspecified: Secondary | ICD-10-CM

## 2023-07-25 DIAGNOSIS — I951 Orthostatic hypotension: Secondary | ICD-10-CM | POA: Diagnosis not present

## 2023-07-25 DIAGNOSIS — E782 Mixed hyperlipidemia: Secondary | ICD-10-CM | POA: Diagnosis not present

## 2023-07-25 DIAGNOSIS — R55 Syncope and collapse: Secondary | ICD-10-CM | POA: Diagnosis not present

## 2023-07-25 MED ORDER — APIXABAN 5 MG PO TABS
5.0000 mg | ORAL_TABLET | Freq: Two times a day (BID) | ORAL | 2 refills | Status: AC
Start: 2023-07-25 — End: ?

## 2023-07-25 NOTE — Progress Notes (Signed)
Cardiology Office Note   Date:  07/25/2023   ID:  Mark Barber, DOB 13-Nov-1933, MRN [id number]  PCP:  [name], MD  Cardiologist:  Adrian Blackwater, MD      History of Present Illness: Mark Barber is a 89 y.o. male who presents for  Chief Complaint  Patient presents with   Follow-up    6 week follow up    No symptoms of afb, has less stamina than 20 years ago      History reviewed. No pertinent past medical history.   Past Surgical History:  Procedure Laterality Date   CORONARY ANGIOPLASTY WITH STENT PLACEMENT  1994   and 1997   TOTAL HIP ARTHROPLASTY Left 1994     Current Outpatient Medications  Medication Sig Dispense Refill   apixaban (ELIQUIS) 5 MG TABS tablet Take 1 tablet (5 mg total) by mouth 2 (two) times daily. 60 tablet 2   atorvastatin (LIPITOR) 40 MG tablet Take 1 tablet (40 mg total) by mouth daily. 90 tablet 2   Cholecalciferol (VITAMIN D3) 250 MCG (10000 UT) capsule Take 10,000 Units by mouth daily.     Omega-3 1000 MG CAPS Take 2 capsules by mouth daily.     metoprolol succinate (TOPROL XL) 25 MG 24 hr tablet Take 1 tablet (25 mg total) by mouth daily. 30 tablet 11   Current Facility-Administered Medications  Medication Dose Route Frequency Provider Last Rate Last Admin   0.9 %  sodium chloride infusion   Intravenous Once         Allergies:   Patient has no known allergies.    Social History:   reports that he has never smoked. He has never used smokeless tobacco. He reports that he does not drink alcohol and does not use drugs.   Family History:  family history is not on file.    ROS:     Review of Systems  Constitutional: Negative.   HENT: Negative.    Eyes: Negative.   Respiratory: Negative.    Gastrointestinal: Negative.   Genitourinary: Negative.   Musculoskeletal: Negative.   Skin: Negative.   Neurological: Negative.   Endo/Heme/Allergies: Negative.   Psychiatric/Behavioral: Negative.    All other systems reviewed  and are negative.     All other systems are reviewed and negative.    PHYSICAL EXAM: VS:  BP 118/74   Pulse 77   Ht 6' (1.829 m)   Wt 164 lb 3.2 oz (74.5 kg)   SpO2 99%   BMI 22.27 kg/m  , BMI Body mass index is 22.27 kg/m. Last weight:  Wt Readings from Last 3 Encounters:  07/25/23 164 lb 3.2 oz (74.5 kg)  06/13/23 162 lb 9.6 oz (73.8 kg)  05/23/23 163 lb (73.9 kg)     Physical Exam Vitals reviewed.  Constitutional:      Appearance: Normal appearance. He is normal weight.  HENT:     Head: Normocephalic.     Nose: Nose normal.     Mouth/Throat:     Mouth: Mucous membranes are moist.  Eyes:     Pupils: Pupils are equal, round, and reactive to light.  Cardiovascular:     Rate and Rhythm: Normal rate and regular rhythm.     Pulses: Normal pulses.     Heart sounds: Normal heart sounds.  Pulmonary:     Effort: Pulmonary effort is normal.  Abdominal:     General: Abdomen is flat. Bowel sounds are normal.  Musculoskeletal:  General: Normal range of motion.     Cervical back: Normal range of motion.  Skin:    General: Skin is warm.  Neurological:     General: No focal deficit present.     Mental Status: He is alert.  Psychiatric:        Mood and Affect: Mood normal.       EKG:   Recent Labs: 05/23/2023: ALT 18; Hemoglobin 13.1; Platelets 179    Lipid Panel    Component Value Date/Time   CHOL 124 05/23/2023 0859   TRIG 50 05/23/2023 0859   HDL 46 05/23/2023 0859   LDLCALC 66 05/23/2023 0859      Other studies Reviewed: Additional studies/ records that were reviewed today include:  Review of the above records demonstrates:       No data to display            ASSESSMENT AND PLAN:    ICD-10-CM   1. Chronic atrial fibrillation (HCC)  I48.20 apixaban (ELIQUIS) 5 MG TABS tablet   Remains asymptomatic, unable to convert with amiodrone, now rate controlled    2. Syncope and collapse  R55 apixaban (ELIQUIS) 5 MG TABS tablet    3. Mixed  hyperlipidemia  E78.2 apixaban (ELIQUIS) 5 MG TABS tablet    4. Orthostatic hypotension  I95.1 apixaban (ELIQUIS) 5 MG TABS tablet    5. Coronary artery disease involving native coronary artery of native heart without angina pectoris  I25.10 apixaban (ELIQUIS) 5 MG TABS tablet       Problem List Items Addressed This Visit       Cardiovascular and Mediastinum   Orthostatic hypotension   Relevant Medications   apixaban (ELIQUIS) 5 MG TABS tablet     Other   Mixed hyperlipidemia   Relevant Medications   apixaban (ELIQUIS) 5 MG TABS tablet   Syncope and collapse   Relevant Medications   apixaban (ELIQUIS) 5 MG TABS tablet   Other Visit Diagnoses     Chronic atrial fibrillation (HCC)    -  Primary   Remains asymptomatic, unable to convert with amiodrone, now rate controlled   Relevant Medications   apixaban (ELIQUIS) 5 MG TABS tablet   Coronary artery disease involving native coronary artery of native heart without angina pectoris       Relevant Medications   apixaban (ELIQUIS) 5 MG TABS tablet          Disposition:   Return in about 3 months (around 10/25/2023).    Total time spent: 30 minutes  Signed,  Adrian Blackwater, MD  07/25/2023 9:44 AM    [facility]

## 2023-08-19 ENCOUNTER — Ambulatory Visit: Payer: Medicare PPO | Admitting: Internal Medicine

## 2023-08-19 ENCOUNTER — Encounter: Payer: Self-pay | Admitting: Internal Medicine

## 2023-08-19 DIAGNOSIS — I2510 Atherosclerotic heart disease of native coronary artery without angina pectoris: Secondary | ICD-10-CM

## 2023-08-19 DIAGNOSIS — Z8679 Personal history of other diseases of the circulatory system: Secondary | ICD-10-CM | POA: Insufficient documentation

## 2023-08-19 DIAGNOSIS — E782 Mixed hyperlipidemia: Secondary | ICD-10-CM | POA: Diagnosis not present

## 2023-08-19 NOTE — Progress Notes (Signed)
Established Patient Office Visit  Subjective:  Patient ID: Mark Barber, male    DOB: 02/20/1934  Age: 89 y.o. MRN: [id number]  Chief Complaint  Patient presents with   Follow-up    3 month follow up    No new complaints, here for lab review and medication refills. Denies any further palpitations, dizziness or fainting spells.    No other concerns at this time.   No past medical history on file.  Past Surgical History:  Procedure Laterality Date   CORONARY ANGIOPLASTY WITH STENT PLACEMENT  1994   and 1997   TOTAL HIP ARTHROPLASTY Left 1994    Social History   Socioeconomic History   Marital status: Married    Spouse name: Not on file   Number of children: Not on file   Years of education: Not on file   Highest education level: Not on file  Occupational History   Not on file  Tobacco Use   Smoking status: Never   Smokeless tobacco: Never  Substance and Sexual Activity   Alcohol use: Never   Drug use: Never   Sexual activity: Yes  Other Topics Concern   Not on file  Social History Narrative   Not on file   Social Drivers of Health   Financial Resource Strain: Not on file  Food Insecurity: Not on file  Transportation Needs: Not on file  Physical Activity: Not on file  Stress: Not on file  Social Connections: Not on file  Intimate Partner Violence: Not on file    No family history on file.  No Known Allergies  Outpatient Medications Prior to Visit  Medication Sig   apixaban (ELIQUIS) 5 MG TABS tablet Take 1 tablet (5 mg total) by mouth 2 (two) times daily.   atorvastatin (LIPITOR) 40 MG tablet Take 1 tablet (40 mg total) by mouth daily.   Cholecalciferol (VITAMIN D3) 250 MCG (10000 UT) capsule Take 10,000 Units by mouth daily.   Omega-3 1000 MG CAPS Take 2 capsules by mouth daily.   metoprolol succinate (TOPROL XL) 25 MG 24 hr tablet Take 1 tablet (25 mg total) by mouth daily.   Facility-Administered Medications Prior to Visit  Medication Dose  Route Frequency Provider   0.9 %  sodium chloride infusion   Intravenous Once     Review of Systems  Constitutional: Negative.   HENT: Negative.    Eyes: Negative.   Respiratory: Negative.    Cardiovascular: Negative.   Gastrointestinal: Negative.   Genitourinary: Negative.   Skin: Negative.   Neurological: Negative.   Endo/Heme/Allergies: Negative.        Objective:   BP 126/68   Pulse 70   Ht 6' (1.829 m)   Wt 164 lb 12.8 oz (74.8 kg)   SpO2 97%   BMI 22.35 kg/m   Vitals:   08/19/23 1402  BP: 126/68  Pulse: 70  Height: 6' (1.829 m)  Weight: 164 lb 12.8 oz (74.8 kg)  SpO2: 97%  BMI (Calculated): 22.35    Physical Exam   No results found for any visits on 08/19/23.  Recent Results (from the past 2160 hours)  CBC     Status: Abnormal   Collection Time: 05/23/23  8:59 AM  Result Value Ref Range   WBC 7.1 3.4 - 10.8 x10E3/uL   RBC 4.07 (L) 4.14 - 5.80 x10E6/uL   Hemoglobin 13.1 13.0 - 17.7 g/dL   Hematocrit 36.6 44.0 - 51.0 %   MCV 99 (H) 79 - 97  fL   MCH 32.2 26.6 - 33.0 pg   MCHC 32.7 31.5 - 35.7 g/dL   RDW 60.4 54.0 - 98.1 %   Platelets 179 150 - 450 x10E3/uL  Hepatic function panel     Status: None   Collection Time: 05/23/23  8:59 AM  Result Value Ref Range   Total Protein 6.6 6.0 - 8.5 g/dL   Albumin 4.3 3.7 - 4.7 g/dL   Bilirubin Total 0.5 0.0 - 1.2 mg/dL   Bilirubin, Direct 1.91 0.00 - 0.40 mg/dL   Alkaline Phosphatase 97 44 - 121 IU/L   AST 23 0 - 40 IU/L   ALT 18 0 - 44 IU/L  Lipid Panel w/o Chol/HDL Ratio     Status: None   Collection Time: 05/23/23  8:59 AM  Result Value Ref Range   Cholesterol, Total 124 100 - 199 mg/dL   Triglycerides 50 0 - 149 mg/dL   HDL 46 >47 mg/dL   VLDL Cholesterol Cal 12 5 - 40 mg/dL   LDL Chol Calc (NIH) 66 0 - 99 mg/dL  Hgb W2N w/o eAG     Status: Abnormal   Collection Time: 05/23/23  8:59 AM  Result Value Ref Range   Hgb A1c MFr Bld 6.4 (H) 4.8 - 5.6 %    Comment:          Prediabetes: 5.7 - 6.4           Diabetes: >6.4          Glycemic control for adults with diabetes: <7.0   CK     Status: None   Collection Time: 05/23/23  8:59 AM  Result Value Ref Range   Total CK 58 30 - 208 U/L      Assessment & Plan:  As per problem list. Problem List Items Addressed This Visit       Other   Mixed hyperlipidemia   Atrial fibrillation, currently in sinus rhythm   Other Visit Diagnoses       Coronary artery disease involving native coronary artery of native heart without angina pectoris           Return in about 4 months (around 12/18/2023) for awv with labs prior.   Total time spent: 20 minutes  Luna Fuse, MD  08/19/2023   This document may have been prepared by [facility] Voice Recognition software and as such may include unintentional dictation errors.

## 2023-10-25 ENCOUNTER — Ambulatory Visit: Payer: Medicare PPO | Admitting: Cardiovascular Disease

## 2023-11-03 ENCOUNTER — Ambulatory Visit: Payer: Medicare PPO | Admitting: Cardiovascular Disease

## 2023-11-03 ENCOUNTER — Encounter: Payer: Self-pay | Admitting: Cardiovascular Disease

## 2023-11-03 VITALS — BP 99/68 | HR 66 | Ht 72.0 in | Wt 168.0 lb

## 2023-11-03 DIAGNOSIS — E782 Mixed hyperlipidemia: Secondary | ICD-10-CM

## 2023-11-03 DIAGNOSIS — Z8679 Personal history of other diseases of the circulatory system: Secondary | ICD-10-CM

## 2023-11-03 DIAGNOSIS — I340 Nonrheumatic mitral (valve) insufficiency: Secondary | ICD-10-CM | POA: Diagnosis not present

## 2023-11-03 DIAGNOSIS — R55 Syncope and collapse: Secondary | ICD-10-CM

## 2023-11-03 DIAGNOSIS — I4891 Unspecified atrial fibrillation: Secondary | ICD-10-CM

## 2023-11-03 DIAGNOSIS — I2510 Atherosclerotic heart disease of native coronary artery without angina pectoris: Secondary | ICD-10-CM | POA: Diagnosis not present

## 2023-11-03 DIAGNOSIS — Z0130 Encounter for examination of blood pressure without abnormal findings: Secondary | ICD-10-CM

## 2023-11-03 NOTE — Progress Notes (Signed)
 Cardiology Office Note   Date:  11/03/2023   ID:  Mark Barber, DOB 1934-05-12, MRN [id number]  PCP:  [name], MD  Cardiologist:  Adrian Blackwater, MD      History of Present Illness: Mark Barber is a 89 y.o. male who presents for  Chief Complaint  Patient presents with   Follow-up    3 month follow up     Doing well      History reviewed. No pertinent past medical history.   Past Surgical History:  Procedure Laterality Date   CORONARY ANGIOPLASTY WITH STENT PLACEMENT  1994   and 1997   TOTAL HIP ARTHROPLASTY Left 1994     Current Outpatient Medications  Medication Sig Dispense Refill   atorvastatin (LIPITOR) 40 MG tablet Take 1 tablet (40 mg total) by mouth daily. 90 tablet 2   Cholecalciferol (VITAMIN D3) 250 MCG (10000 UT) capsule Take 10,000 Units by mouth daily.     Omega-3 1000 MG CAPS Take 2 capsules by mouth daily.     apixaban (ELIQUIS) 5 MG TABS tablet Take 1 tablet (5 mg total) by mouth 2 (two) times daily. (Patient not taking: Reported on 11/03/2023) 60 tablet 2   metoprolol succinate (TOPROL XL) 25 MG 24 hr tablet Take 1 tablet (25 mg total) by mouth daily. 30 tablet 11   Current Facility-Administered Medications  Medication Dose Route Frequency Provider Last Rate Last Admin   0.9 %  sodium chloride infusion   Intravenous Once         Allergies:   Patient has no known allergies.    Social History:   reports that he has never smoked. He has never used smokeless tobacco. He reports that he does not drink alcohol and does not use drugs.   Family History:  family history is not on file.    ROS:     Review of Systems  Constitutional: Negative.   HENT: Negative.    Eyes: Negative.   Respiratory: Negative.    Gastrointestinal: Negative.   Genitourinary: Negative.   Musculoskeletal: Negative.   Skin: Negative.   Neurological: Negative.   Endo/Heme/Allergies: Negative.   Psychiatric/Behavioral: Negative.    All other systems reviewed  and are negative.     All other systems are reviewed and negative.    PHYSICAL EXAM: VS:  BP 99/68   Pulse 66   Ht 6' (1.829 m)   Wt 168 lb (76.2 kg)   SpO2 98%   BMI 22.78 kg/m  , BMI Body mass index is 22.78 kg/m. Last weight:  Wt Readings from Last 3 Encounters:  11/03/23 168 lb (76.2 kg)  08/19/23 164 lb 12.8 oz (74.8 kg)  07/25/23 164 lb 3.2 oz (74.5 kg)     Physical Exam Vitals reviewed.  Constitutional:      Appearance: Normal appearance. He is normal weight.  HENT:     Head: Normocephalic.     Nose: Nose normal.     Mouth/Throat:     Mouth: Mucous membranes are moist.  Eyes:     Pupils: Pupils are equal, round, and reactive to light.  Cardiovascular:     Rate and Rhythm: Normal rate and regular rhythm.     Pulses: Normal pulses.     Heart sounds: Normal heart sounds.  Pulmonary:     Effort: Pulmonary effort is normal.  Abdominal:     General: Abdomen is flat. Bowel sounds are normal.  Musculoskeletal:  General: Normal range of motion.     Cervical back: Normal range of motion.  Skin:    General: Skin is warm.  Neurological:     General: No focal deficit present.     Mental Status: He is alert.  Psychiatric:        Mood and Affect: Mood normal.       EKG:   Recent Labs: 05/23/2023: ALT 18; Hemoglobin 13.1; Platelets 179    Lipid Panel    Component Value Date/Time   CHOL 124 05/23/2023 0859   TRIG 50 05/23/2023 0859   HDL 46 05/23/2023 0859   LDLCALC 66 05/23/2023 0859      Other studies Reviewed: Additional studies/ records that were reviewed today include:  Review of the above records demonstrates:       No data to display            ASSESSMENT AND PLAN:    ICD-10-CM   1. Atrial fibrillation, currently in sinus rhythm  Z86.79     2. Coronary artery disease involving native coronary artery of native heart without angina pectoris  I25.10     3. Mixed hyperlipidemia  E78.2     4. Syncope and collapse  R55      5. Nonrheumatic mitral valve regurgitation  I34.0     6. Atrial fibrillation, unspecified type (HCC)  I48.91    asymptomatic controlled rate       Problem List Items Addressed This Visit       Other   Mixed hyperlipidemia   Syncope and collapse   Atrial fibrillation, currently in sinus rhythm - Primary   Other Visit Diagnoses       Coronary artery disease involving native coronary artery of native heart without angina pectoris         Nonrheumatic mitral valve regurgitation         Atrial fibrillation, unspecified type (HCC)       asymptomatic controlled rate          Disposition:   No follow-ups on file.    Total time spent: 30 minutes  Signed,  Adrian Blackwater, MD  11/03/2023 11:34 AM    [facility]

## 2023-12-13 ENCOUNTER — Other Ambulatory Visit: Payer: Self-pay | Admitting: Internal Medicine

## 2023-12-13 ENCOUNTER — Other Ambulatory Visit

## 2023-12-13 DIAGNOSIS — I2510 Atherosclerotic heart disease of native coronary artery without angina pectoris: Secondary | ICD-10-CM | POA: Diagnosis not present

## 2023-12-13 DIAGNOSIS — Z8679 Personal history of other diseases of the circulatory system: Secondary | ICD-10-CM | POA: Diagnosis not present

## 2023-12-13 DIAGNOSIS — E782 Mixed hyperlipidemia: Secondary | ICD-10-CM | POA: Diagnosis not present

## 2023-12-13 DIAGNOSIS — D649 Anemia, unspecified: Secondary | ICD-10-CM | POA: Diagnosis not present

## 2023-12-13 DIAGNOSIS — R7301 Impaired fasting glucose: Secondary | ICD-10-CM | POA: Diagnosis not present

## 2023-12-13 DIAGNOSIS — D519 Vitamin B12 deficiency anemia, unspecified: Secondary | ICD-10-CM | POA: Diagnosis not present

## 2023-12-14 LAB — CBC WITH DIFF/PLATELET
Basophils Absolute: 0.1 10*3/uL (ref 0.0–0.2)
Basos: 1 %
EOS (ABSOLUTE): 0.4 10*3/uL (ref 0.0–0.4)
Eos: 7 %
Hematocrit: 40 % (ref 37.5–51.0)
Hemoglobin: 12.9 g/dL — ABNORMAL LOW (ref 13.0–17.7)
Immature Grans (Abs): 0 10*3/uL (ref 0.0–0.1)
Immature Granulocytes: 1 %
Lymphocytes Absolute: 1.4 10*3/uL (ref 0.7–3.1)
Lymphs: 22 %
MCH: 31.6 pg (ref 26.6–33.0)
MCHC: 32.3 g/dL (ref 31.5–35.7)
MCV: 98 fL — ABNORMAL HIGH (ref 79–97)
Monocytes Absolute: 0.7 10*3/uL (ref 0.1–0.9)
Monocytes: 12 %
Neutrophils Absolute: 3.6 10*3/uL (ref 1.4–7.0)
Neutrophils: 57 %
Platelets: 197 10*3/uL (ref 150–450)
RBC: 4.08 x10E6/uL — ABNORMAL LOW (ref 4.14–5.80)
RDW: 12.6 % (ref 11.6–15.4)
WBC: 6.1 10*3/uL (ref 3.4–10.8)

## 2023-12-14 LAB — COMPREHENSIVE METABOLIC PANEL WITH GFR
ALT: 14 IU/L (ref 0–44)
AST: 19 IU/L (ref 0–40)
Albumin: 4.1 g/dL (ref 3.7–4.7)
Alkaline Phosphatase: 93 IU/L (ref 44–121)
BUN/Creatinine Ratio: 24 (ref 10–24)
BUN: 26 mg/dL (ref 8–27)
Bilirubin Total: 0.5 mg/dL (ref 0.0–1.2)
CO2: 23 mmol/L (ref 20–29)
Calcium: 9.4 mg/dL (ref 8.6–10.2)
Chloride: 107 mmol/L — ABNORMAL HIGH (ref 96–106)
Creatinine, Ser: 1.1 mg/dL (ref 0.76–1.27)
Globulin, Total: 2.4 g/dL (ref 1.5–4.5)
Glucose: 114 mg/dL — ABNORMAL HIGH (ref 70–99)
Potassium: 4.6 mmol/L (ref 3.5–5.2)
Sodium: 143 mmol/L (ref 134–144)
Total Protein: 6.5 g/dL (ref 6.0–8.5)
eGFR: 65 mL/min/{1.73_m2} (ref >59)

## 2023-12-14 LAB — LIPID PANEL
Chol/HDL Ratio: 3.3 ratio (ref 0.0–5.0)
Cholesterol, Total: 143 mg/dL (ref 100–199)
HDL: 44 mg/dL (ref >39)
LDL Chol Calc (NIH): 84 mg/dL (ref 0–99)
Triglycerides: 78 mg/dL (ref 0–149)
VLDL Cholesterol Cal: 15 mg/dL (ref 5–40)

## 2023-12-19 ENCOUNTER — Ambulatory Visit: Payer: Medicare PPO | Admitting: Internal Medicine

## 2023-12-19 ENCOUNTER — Encounter: Payer: Self-pay | Admitting: Internal Medicine

## 2023-12-19 VITALS — BP 130/80 | HR 77 | Temp 96.6°F | Ht 72.0 in | Wt 172.0 lb

## 2023-12-19 DIAGNOSIS — Z8679 Personal history of other diseases of the circulatory system: Secondary | ICD-10-CM | POA: Diagnosis not present

## 2023-12-19 DIAGNOSIS — Z0130 Encounter for examination of blood pressure without abnormal findings: Secondary | ICD-10-CM

## 2023-12-19 DIAGNOSIS — D649 Anemia, unspecified: Secondary | ICD-10-CM

## 2023-12-19 DIAGNOSIS — E782 Mixed hyperlipidemia: Secondary | ICD-10-CM | POA: Diagnosis not present

## 2023-12-19 DIAGNOSIS — Z1331 Encounter for screening for depression: Secondary | ICD-10-CM | POA: Diagnosis not present

## 2023-12-19 DIAGNOSIS — R7303 Prediabetes: Secondary | ICD-10-CM

## 2023-12-19 DIAGNOSIS — Z0001 Encounter for general adult medical examination with abnormal findings: Secondary | ICD-10-CM | POA: Diagnosis not present

## 2023-12-19 NOTE — Progress Notes (Signed)
 Established Patient Office Visit  Subjective:  Patient ID: Mark Barber, male    DOB: 1934-05-19  Age: 89 y.o. MRN: [id number]  Chief Complaint  Patient presents with   Annual Exam    AWV Room 1    No new complaints, here for AWV refer to quality metrics and scanned documents.       No other concerns at this time.   No past medical history on file.  Past Surgical History:  Procedure Laterality Date   CORONARY ANGIOPLASTY WITH STENT PLACEMENT  1994   and 1997   TOTAL HIP ARTHROPLASTY Left 1994    Social History   Socioeconomic History   Marital status: Married    Spouse name: Not on file   Number of children: Not on file   Years of education: Not on file   Highest education level: Not on file  Occupational History   Not on file  Tobacco Use   Smoking status: Never   Smokeless tobacco: Never  Substance and Sexual Activity   Alcohol use: Never   Drug use: Never   Sexual activity: Yes  Other Topics Concern   Not on file  Social History Narrative   Not on file   Social Drivers of Health   Financial Resource Strain: Not on file  Food Insecurity: Not on file  Transportation Needs: Not on file  Physical Activity: Not on file  Stress: Not on file  Social Connections: Not on file  Intimate Partner Violence: Not on file    No family history on file.  No Known Allergies  Outpatient Medications Prior to Visit  Medication Sig   atorvastatin  (LIPITOR) 40 MG tablet Take 1 tablet (40 mg total) by mouth daily.   Cholecalciferol (VITAMIN D3) 250 MCG (10000 UT) capsule Take 10,000 Units by mouth daily.   Omega-3 1000 MG CAPS Take 2 capsules by mouth daily.   apixaban  (ELIQUIS ) 5 MG TABS tablet Take 1 tablet (5 mg total) by mouth 2 (two) times daily. (Patient not taking: Reported on 12/19/2023)   metoprolol  succinate (TOPROL  XL) 25 MG 24 hr tablet Take 1 tablet (25 mg total) by mouth daily. (Patient not taking: Reported on 12/19/2023)   Facility-Administered  Medications Prior to Visit  Medication Dose Route Frequency Provider   0.9 %  sodium chloride  infusion   Intravenous Once     Review of Systems  Constitutional: Negative.   HENT: Negative.    Eyes: Negative.   Respiratory: Negative.    Gastrointestinal: Negative.   Genitourinary: Negative.   Musculoskeletal: Negative.   Skin: Negative.   Neurological: Negative.   Endo/Heme/Allergies: Negative.   Psychiatric/Behavioral: Negative.    All other systems reviewed and are negative.      Objective:   BP 130/80   Pulse 77   Temp (!) 96.6 F (35.9 C)   Ht 6' (1.829 m)   Wt 172 lb (78 kg)   SpO2 95%   BMI 23.33 kg/m   Vitals:   12/19/23 0932  BP: 130/80  Pulse: 77  Temp: (!) 96.6 F (35.9 C)  Height: 6' (1.829 m)  Weight: 172 lb (78 kg)  SpO2: 95%  BMI (Calculated): 23.32    Physical Exam Vitals reviewed.  Constitutional:      Appearance: Normal appearance. He is normal weight.  HENT:     Head: Normocephalic.     Nose: Nose normal.     Mouth/Throat:     Mouth: Mucous membranes are moist.  Eyes:  Pupils: Pupils are equal, round, and reactive to light.  Cardiovascular:     Rate and Rhythm: Normal rate and regular rhythm.     Pulses: Normal pulses.     Heart sounds: Normal heart sounds.  Pulmonary:     Effort: Pulmonary effort is normal.  Abdominal:     General: Abdomen is flat. Bowel sounds are normal.  Musculoskeletal:        General: Normal range of motion.     Cervical back: Normal range of motion.  Skin:    General: Skin is warm.  Neurological:     General: No focal deficit present.     Mental Status: He is alert.  Psychiatric:        Mood and Affect: Mood normal.      No results found for any visits on 12/19/23.  Recent Results (from the past 2160 hours)  CBC With Diff/Platelet     Status: Abnormal   Collection Time: 12/13/23  8:10 AM  Result Value Ref Range   WBC 6.1 3.4 - 10.8 x10E3/uL   RBC 4.08 (L) 4.14 - 5.80 x10E6/uL    Hemoglobin 12.9 (L) 13.0 - 17.7 g/dL   Hematocrit 08.6 57.8 - 51.0 %   MCV 98 (H) 79 - 97 fL   MCH 31.6 26.6 - 33.0 pg   MCHC 32.3 31.5 - 35.7 g/dL   RDW 46.9 62.9 - 52.8 %   Platelets 197 150 - 450 x10E3/uL   Neutrophils 57 Not Estab. %   Lymphs 22 Not Estab. %   Monocytes 12 Not Estab. %   Eos 7 Not Estab. %   Basos 1 Not Estab. %   Neutrophils Absolute 3.6 1.4 - 7.0 x10E3/uL   Lymphocytes Absolute 1.4 0.7 - 3.1 x10E3/uL   Monocytes Absolute 0.7 0.1 - 0.9 x10E3/uL   EOS (ABSOLUTE) 0.4 0.0 - 0.4 x10E3/uL   Basophils Absolute 0.1 0.0 - 0.2 x10E3/uL   Immature Granulocytes 1 Not Estab. %   Immature Grans (Abs) 0.0 0.0 - 0.1 x10E3/uL  Comprehensive metabolic panel with GFR     Status: Abnormal   Collection Time: 12/13/23  8:10 AM  Result Value Ref Range   Glucose 114 (H) 70 - 99 mg/dL   BUN 26 8 - 27 mg/dL   Creatinine, Ser 4.13 0.76 - 1.27 mg/dL   eGFR 65 >24 MW/NUU/7.25   BUN/Creatinine Ratio 24 10 - 24   Sodium 143 134 - 144 mmol/L   Potassium 4.6 3.5 - 5.2 mmol/L   Chloride 107 (H) 96 - 106 mmol/L   CO2 23 20 - 29 mmol/L   Calcium  9.4 8.6 - 10.2 mg/dL   Total Protein 6.5 6.0 - 8.5 g/dL   Albumin 4.1 3.7 - 4.7 g/dL   Globulin, Total 2.4 1.5 - 4.5 g/dL   Bilirubin Total 0.5 0.0 - 1.2 mg/dL   Alkaline Phosphatase 93 44 - 121 IU/L   AST 19 0 - 40 IU/L   ALT 14 0 - 44 IU/L  Lipid panel     Status: None   Collection Time: 12/13/23  8:10 AM  Result Value Ref Range   Cholesterol, Total 143 100 - 199 mg/dL   Triglycerides 78 0 - 149 mg/dL   HDL 44 >36 mg/dL   VLDL Cholesterol Cal 15 5 - 40 mg/dL   LDL Chol Calc (NIH) 84 0 - 99 mg/dL   Chol/HDL Ratio 3.3 0.0 - 5.0 ratio    Comment:  T. Chol/HDL Ratio                                             Men  Women                               1/2 Avg.Risk  3.4    3.3                                   Avg.Risk  5.0    4.4                                2X Avg.Risk  9.6    7.1                                 3X Avg.Risk 23.4   11.0       Assessment & Plan:  As per problem list. Order hematinic analysis and will call with results. Problem List Items Addressed This Visit       Other   Mixed hyperlipidemia   Relevant Orders   Hepatic function panel   Comprehensive metabolic panel with GFR   Hemoglobin A1c   Atrial fibrillation, currently in sinus rhythm   Other Visit Diagnoses       Normochromic normocytic anemia    -  Primary   Relevant Orders   Folate   Iron, TIBC and Ferritin Panel   Vitamin B12     Prediabetes           Return in about 4 months (around 04/19/2024) for fu with labs prior.   Total time spent: 20 minutes  Arzella Bitters, MD  12/19/2023   This document may have been prepared by [facility] Voice Recognition software and as such may include unintentional dictation errors.

## 2023-12-20 LAB — IRON,TIBC AND FERRITIN PANEL
Ferritin: 114 ng/mL (ref 30–400)
Iron Saturation: 30 % (ref 15–55)
Iron: 77 ug/dL (ref 38–169)
Total Iron Binding Capacity: 255 ug/dL (ref 250–450)
UIBC: 178 ug/dL (ref 111–343)

## 2023-12-20 LAB — VITAMIN B12: Vitamin B-12: 325 pg/mL (ref 232–1245)

## 2023-12-20 LAB — FOLATE: Folate: 5.2 ng/mL (ref >3.0)

## 2023-12-20 LAB — SPECIMEN STATUS REPORT

## 2023-12-22 LAB — HEPATIC FUNCTION PANEL
ALT: 15 IU/L (ref 0–44)
AST: 16 IU/L (ref 0–40)
Albumin: 4.2 g/dL (ref 3.7–4.7)
Alkaline Phosphatase: 94 IU/L (ref 44–121)
Bilirubin Total: <0.2 mg/dL (ref 0.0–1.2)
Bilirubin, Direct: <0.08 mg/dL (ref 0.00–0.40)
Total Protein: 6.6 g/dL (ref 6.0–8.5)

## 2023-12-22 LAB — HEMOGLOBIN A1C
Est. average glucose Bld gHb Est-mCnc: 131 mg/dL
Hgb A1c MFr Bld: 6.2 % — ABNORMAL HIGH (ref 4.8–5.6)

## 2023-12-22 LAB — SPECIMEN STATUS REPORT

## 2024-02-17 ENCOUNTER — Ambulatory Visit: Admitting: Cardiovascular Disease

## 2024-02-17 ENCOUNTER — Encounter: Payer: Self-pay | Admitting: Cardiovascular Disease

## 2024-02-17 VITALS — BP 115/70 | HR 81 | Ht 72.0 in | Wt 170.8 lb

## 2024-02-17 DIAGNOSIS — Z0130 Encounter for examination of blood pressure without abnormal findings: Secondary | ICD-10-CM

## 2024-02-17 DIAGNOSIS — D649 Anemia, unspecified: Secondary | ICD-10-CM | POA: Diagnosis not present

## 2024-02-17 DIAGNOSIS — R0602 Shortness of breath: Secondary | ICD-10-CM | POA: Diagnosis not present

## 2024-02-17 DIAGNOSIS — I340 Nonrheumatic mitral (valve) insufficiency: Secondary | ICD-10-CM | POA: Diagnosis not present

## 2024-02-17 DIAGNOSIS — I2510 Atherosclerotic heart disease of native coronary artery without angina pectoris: Secondary | ICD-10-CM | POA: Diagnosis not present

## 2024-02-17 DIAGNOSIS — E782 Mixed hyperlipidemia: Secondary | ICD-10-CM

## 2024-02-17 DIAGNOSIS — I482 Chronic atrial fibrillation, unspecified: Secondary | ICD-10-CM

## 2024-02-17 NOTE — Progress Notes (Signed)
 Cardiology Office Note   Date:  02/17/2024   ID:  Mark Barber, DOB [birth year], MRN [id number]  PCP:  Shari Daughters, MD  Cardiologist:  Debborah Fairly, MD      History of Present Illness: Mark Barber is a 89 y.o. male who presents for  Chief Complaint  Patient presents with   Follow-up    3 month follow up    No complaints.      History reviewed. No pertinent past medical history.   Past Surgical History:  Procedure Laterality Date   CORONARY ANGIOPLASTY WITH STENT PLACEMENT  1994   and 1997   TOTAL HIP ARTHROPLASTY Left 1994     Current Outpatient Medications  Medication Sig Dispense Refill   apixaban  (ELIQUIS ) 5 MG TABS tablet Take 1 tablet (5 mg total) by mouth 2 (two) times daily. 60 tablet 2   atorvastatin  (LIPITOR) 40 MG tablet Take 1 tablet (40 mg total) by mouth daily. 90 tablet 2   Cholecalciferol (VITAMIN D3) 250 MCG (10000 UT) capsule Take 10,000 Units by mouth daily.     metoprolol  succinate (TOPROL  XL) 25 MG 24 hr tablet Take 1 tablet (25 mg total) by mouth daily. 30 tablet 11   Omega-3 1000 MG CAPS Take 2 capsules by mouth daily.     Current Facility-Administered Medications  Medication Dose Route Frequency Provider Last Rate Last Admin   0.9 %  sodium chloride  infusion   Intravenous Once         Allergies:   Patient has no known allergies.    Social History:   reports that he has never smoked. He has never used smokeless tobacco. He reports that he does not drink alcohol and does not use drugs.   Family History:  family history is not on file.    ROS:     Review of Systems  Constitutional: Negative.   HENT: Negative.    Eyes: Negative.   Respiratory: Negative.    Gastrointestinal: Negative.   Genitourinary: Negative.   Musculoskeletal: Negative.   Skin: Negative.   Neurological: Negative.   Endo/Heme/Allergies: Negative.   Psychiatric/Behavioral: Negative.    All other systems reviewed and are negative.     All other  systems are reviewed and negative.    PHYSICAL EXAM: VS:  BP 115/70   Pulse 81   Ht 6' (1.829 m)   Wt 170 lb 12.8 oz (77.5 kg)   SpO2 94%   BMI 23.16 kg/m  , BMI Body mass index is 23.16 kg/m. Last weight:  Wt Readings from Last 3 Encounters:  02/17/24 170 lb 12.8 oz (77.5 kg)  12/19/23 172 lb (78 kg)  11/03/23 168 lb (76.2 kg)     Physical Exam Vitals reviewed.  Constitutional:      Appearance: Normal appearance. He is normal weight.  HENT:     Head: Normocephalic.     Nose: Nose normal.     Mouth/Throat:     Mouth: Mucous membranes are moist.   Eyes:     Pupils: Pupils are equal, round, and reactive to light.    Cardiovascular:     Rate and Rhythm: Normal rate and regular rhythm.     Pulses: Normal pulses.     Heart sounds: Normal heart sounds.  Pulmonary:     Effort: Pulmonary effort is normal.  Abdominal:     General: Abdomen is flat. Bowel sounds are normal.   Musculoskeletal:        General:  Normal range of motion.     Cervical back: Normal range of motion.   Skin:    General: Skin is warm.   Neurological:     General: No focal deficit present.     Mental Status: He is alert.   Psychiatric:        Mood and Affect: Mood normal.       EKG:   Recent Labs: 12/13/2023: ALT 14; ALT 15; BUN 26; Creatinine, Ser 1.10; Hemoglobin 12.9; Platelets 197; Potassium 4.6; Sodium 143    Lipid Panel    Component Value Date/Time   CHOL 143 12/13/2023 0810   TRIG 78 12/13/2023 0810   HDL 44 12/13/2023 0810   CHOLHDL 3.3 12/13/2023 0810   LDLCALC 84 12/13/2023 0810      Other studies Reviewed: Additional studies/ records that were reviewed today include:  Review of the above records demonstrates:       No data to display            ASSESSMENT AND PLAN:    ICD-10-CM   1. Chronic atrial fibrillation (HCC)  I48.20    Rate controlled, asymptomatic.    2. Mixed hyperlipidemia  E78.2     3. Normochromic normocytic anemia  D64.9     4.  Coronary artery disease involving native coronary artery of native heart without angina pectoris  I25.10     5. Nonrheumatic mitral valve regurgitation  I34.0     6. SOB (shortness of breath)  R06.02    has DOE       Problem List Items Addressed This Visit       Other   Mixed hyperlipidemia   Other Visit Diagnoses       Chronic atrial fibrillation (HCC)    -  Primary   Rate controlled, asymptomatic.     Normochromic normocytic anemia         Coronary artery disease involving native coronary artery of native heart without angina pectoris         Nonrheumatic mitral valve regurgitation         SOB (shortness of breath)       has DOE          Disposition:   Return in about 3 months (around 05/19/2024).    Total time spent: 35 minutes  Signed,  Debborah Fairly, MD  02/17/2024 9:19 AM    [facility]

## 2024-03-06 ENCOUNTER — Other Ambulatory Visit: Payer: Self-pay | Admitting: Cardiovascular Disease

## 2024-03-06 DIAGNOSIS — I2510 Atherosclerotic heart disease of native coronary artery without angina pectoris: Secondary | ICD-10-CM

## 2024-03-06 DIAGNOSIS — E782 Mixed hyperlipidemia: Secondary | ICD-10-CM

## 2024-03-06 DIAGNOSIS — I340 Nonrheumatic mitral (valve) insufficiency: Secondary | ICD-10-CM

## 2024-03-06 DIAGNOSIS — I482 Chronic atrial fibrillation, unspecified: Secondary | ICD-10-CM

## 2024-04-16 ENCOUNTER — Other Ambulatory Visit

## 2024-04-16 DIAGNOSIS — D649 Anemia, unspecified: Secondary | ICD-10-CM | POA: Diagnosis not present

## 2024-04-16 DIAGNOSIS — E782 Mixed hyperlipidemia: Secondary | ICD-10-CM | POA: Diagnosis not present

## 2024-04-17 LAB — VITAMIN B12: Vitamin B-12: 379 pg/mL (ref 232–1245)

## 2024-04-17 LAB — HEPATIC FUNCTION PANEL
ALT: 16 IU/L (ref 0–44)
AST: 21 IU/L (ref 0–40)
Albumin: 4.8 g/dL — ABNORMAL HIGH (ref 3.7–4.7)
Alkaline Phosphatase: 56 IU/L (ref 44–121)
Bilirubin Total: 1.1 mg/dL (ref 0.0–1.2)
Bilirubin, Direct: 0.31 mg/dL (ref 0.00–0.40)
Total Protein: 7.4 g/dL (ref 6.0–8.5)

## 2024-04-17 LAB — FOLATE: Folate: 6.6 ng/mL (ref >3.0)

## 2024-04-17 LAB — HEMOGLOBIN A1C
Est. average glucose Bld gHb Est-mCnc: 131 mg/dL
Hgb A1c MFr Bld: 6.2 % — ABNORMAL HIGH (ref 4.8–5.6)

## 2024-04-17 LAB — IRON,TIBC AND FERRITIN PANEL
Ferritin: 119 ng/mL (ref 30–400)
Iron Saturation: 37 % (ref 15–55)
Iron: 101 ug/dL (ref 38–169)
Total Iron Binding Capacity: 273 ug/dL (ref 250–450)
UIBC: 172 ug/dL (ref 111–343)

## 2024-04-23 ENCOUNTER — Ambulatory Visit: Payer: Self-pay | Admitting: Internal Medicine

## 2024-04-23 ENCOUNTER — Ambulatory Visit: Admitting: Internal Medicine

## 2024-04-23 ENCOUNTER — Encounter: Payer: Self-pay | Admitting: Internal Medicine

## 2024-04-23 VITALS — BP 122/64 | HR 74 | Ht 72.0 in | Wt 174.6 lb

## 2024-04-23 DIAGNOSIS — R7303 Prediabetes: Secondary | ICD-10-CM

## 2024-04-23 DIAGNOSIS — D649 Anemia, unspecified: Secondary | ICD-10-CM

## 2024-04-23 DIAGNOSIS — E782 Mixed hyperlipidemia: Secondary | ICD-10-CM

## 2024-04-23 DIAGNOSIS — Z0130 Encounter for examination of blood pressure without abnormal findings: Secondary | ICD-10-CM

## 2024-04-23 NOTE — Progress Notes (Signed)
 Established Patient Office Visit  Subjective:  Patient ID: Mark Barber, male    DOB: 10-Oct-1933  Age: 89 y.o. MRN: [id number]  Chief Complaint  Patient presents with   Follow-up    4 month follow up    No new complaints, here for lab review and medication refills. Labs reviewed and notable for stable A1c in the prediabetic range. Hematinic profile was unremarkable and denies any overt bleeding. No palpitations or SOB since last visit.    No other concerns at this time.   No past medical history on file.  Past Surgical History:  Procedure Laterality Date   CORONARY ANGIOPLASTY WITH STENT PLACEMENT  1994   and 1997   TOTAL HIP ARTHROPLASTY Left 1994    Social History   Socioeconomic History   Marital status: Married    Spouse name: Not on file   Number of children: Not on file   Years of education: Not on file   Highest education level: Not on file  Occupational History   Not on file  Tobacco Use   Smoking status: Never   Smokeless tobacco: Never  Substance and Sexual Activity   Alcohol use: Never   Drug use: Never   Sexual activity: Yes  Other Topics Concern   Not on file  Social History Narrative   Not on file   Social Drivers of Health   Financial Resource Strain: Not on file  Food Insecurity: Not on file  Transportation Needs: Not on file  Physical Activity: Not on file  Stress: Not on file  Social Connections: Not on file  Intimate Partner Violence: Not on file    No family history on file.  No Known Allergies  Outpatient Medications Prior to Visit  Medication Sig   apixaban  (ELIQUIS ) 5 MG TABS tablet Take 1 tablet (5 mg total) by mouth 2 (two) times daily.   atorvastatin  (LIPITOR) 40 MG tablet TAKE 1 TABLET BY MOUTH EVERY DAY   Cholecalciferol (VITAMIN D3) 250 MCG (10000 UT) capsule Take 10,000 Units by mouth daily.   metoprolol  succinate (TOPROL  XL) 25 MG 24 hr tablet Take 1 tablet (25 mg total) by mouth daily.   Omega-3 1000 MG CAPS Take 2  capsules by mouth daily.   Facility-Administered Medications Prior to Visit  Medication Dose Route Frequency Provider   0.9 %  sodium chloride  infusion   Intravenous Once     Review of Systems  Constitutional: Negative.   HENT: Negative.    Eyes: Negative.   Respiratory: Negative.    Gastrointestinal: Negative.   Genitourinary: Negative.   Musculoskeletal: Negative.   Skin: Negative.   Neurological: Negative.   Endo/Heme/Allergies: Negative.   Psychiatric/Behavioral: Negative.    All other systems reviewed and are negative.      Objective:   BP 122/64   Pulse 74   Ht 6' (1.829 m)   Wt 174 lb 9.6 oz (79.2 kg)   SpO2 94%   BMI 23.68 kg/m   Vitals:   04/23/24 0946  BP: 122/64  Pulse: 74  Height: 6' (1.829 m)  Weight: 174 lb 9.6 oz (79.2 kg)  SpO2: 94%  BMI (Calculated): 23.67    Physical Exam Vitals reviewed.  Constitutional:      Appearance: Normal appearance. He is normal weight.  HENT:     Head: Normocephalic.     Nose: Nose normal.     Mouth/Throat:     Mouth: Mucous membranes are moist.  Eyes:  Pupils: Pupils are equal, round, and reactive to light.  Cardiovascular:     Rate and Rhythm: Normal rate and regular rhythm.     Pulses: Normal pulses.     Heart sounds: Normal heart sounds.  Pulmonary:     Effort: Pulmonary effort is normal.  Abdominal:     General: Abdomen is flat. Bowel sounds are normal.  Musculoskeletal:        General: Normal range of motion.     Cervical back: Normal range of motion.  Skin:    General: Skin is warm.  Neurological:     General: No focal deficit present.     Mental Status: He is alert.  Psychiatric:        Mood and Affect: Mood normal.      No results found for any visits on 04/23/24.  Recent Results (from the past 2160 hours)  Iron, TIBC and Ferritin Panel     Status: None   Collection Time: 04/16/24  9:01 AM  Result Value Ref Range   Total Iron Binding Capacity 273 250 - 450 ug/dL   UIBC 827 888 -  656 ug/dL   Iron 898 38 - 830 ug/dL   Iron Saturation 37 15 - 55 %   Ferritin 119 30 - 400 ng/mL  Folate     Status: None   Collection Time: 04/16/24  9:01 AM  Result Value Ref Range   Folate 6.6 >3.0 ng/mL    Comment: A serum folate concentration of less than 3.1 ng/mL is considered to represent clinical deficiency.   Vitamin B12     Status: None   Collection Time: 04/16/24  9:01 AM  Result Value Ref Range   Vitamin B-12 379 232 - 1,245 pg/mL  Hepatic function panel     Status: Abnormal   Collection Time: 04/16/24  9:27 AM  Result Value Ref Range   Total Protein 7.4 6.0 - 8.5 g/dL   Albumin 4.8 (H) 3.7 - 4.7 g/dL   Bilirubin Total 1.1 0.0 - 1.2 mg/dL   Bilirubin, Direct 9.68 0.00 - 0.40 mg/dL   Alkaline Phosphatase 56 44 - 121 IU/L   AST 21 0 - 40 IU/L   ALT 16 0 - 44 IU/L  Hemoglobin A1c     Status: Abnormal   Collection Time: 04/16/24  9:27 AM  Result Value Ref Range   Hgb A1c MFr Bld 6.2 (H) 4.8 - 5.6 %    Comment:          Prediabetes: 5.7 - 6.4          Diabetes: >6.4          Glycemic control for adults with diabetes: <7.0    Est. average glucose Bld gHb Est-mCnc 131 mg/dL      Assessment & Plan:  Mark Barber was seen today for follow-up.  Prediabetes -     Hemoglobin A1c  Mixed hyperlipidemia -     Lipid panel -     Comprehensive metabolic panel with GFR  Normochromic normocytic anemia    Problem List Items Addressed This Visit       Other   Mixed hyperlipidemia   Relevant Orders   Lipid panel   Other Visit Diagnoses       Prediabetes    -  Primary   Relevant Orders   Hemoglobin A1c     Normochromic normocytic anemia           Return in about 4 months (around 08/23/2024) for  fu with labs prior.   Total time spent: 20 minutes  Sherrill Cinderella Perry, MD  04/23/2024   This document may have been prepared by [facility] Voice Recognition software and as such may include unintentional dictation errors.

## 2024-05-18 ENCOUNTER — Encounter: Payer: Self-pay | Admitting: Cardiovascular Disease

## 2024-05-18 ENCOUNTER — Ambulatory Visit: Admitting: Cardiovascular Disease

## 2024-05-18 VITALS — BP 128/62 | HR 79 | Ht 72.0 in | Wt 172.0 lb

## 2024-05-18 DIAGNOSIS — Z8679 Personal history of other diseases of the circulatory system: Secondary | ICD-10-CM

## 2024-05-18 DIAGNOSIS — I951 Orthostatic hypotension: Secondary | ICD-10-CM

## 2024-05-18 DIAGNOSIS — R55 Syncope and collapse: Secondary | ICD-10-CM

## 2024-05-18 DIAGNOSIS — E782 Mixed hyperlipidemia: Secondary | ICD-10-CM | POA: Diagnosis not present

## 2024-05-18 NOTE — Progress Notes (Signed)
 Cardiology Office Note   Date:  05/18/2024   ID:  Mark Barber, DOB 1933/09/24, MRN [id number]  PCP:  Mark GORMAN Dine, MD  Cardiologist:  Mark Bathe, MD      History of Present Illness: Mark Barber is a 89 y.o. male who presents for  Chief Complaint  Patient presents with   Follow-up    3 month follow up     No SOB, or chest pain.      History reviewed. No pertinent past medical history.   Past Surgical History:  Procedure Laterality Date   CORONARY ANGIOPLASTY WITH STENT PLACEMENT  1994   and 1997   TOTAL HIP ARTHROPLASTY Left 1994     Current Outpatient Medications  Medication Sig Dispense Refill   apixaban  (ELIQUIS ) 5 MG TABS tablet Take 1 tablet (5 mg total) by mouth 2 (two) times daily. 60 tablet 2   atorvastatin  (LIPITOR) 40 MG tablet TAKE 1 TABLET BY MOUTH EVERY DAY 90 tablet 2   Cholecalciferol (VITAMIN D3) 250 MCG (10000 UT) capsule Take 10,000 Units by mouth daily.     metoprolol  succinate (TOPROL  XL) 25 MG 24 hr tablet Take 1 tablet (25 mg total) by mouth daily. 30 tablet 11   Omega-3 1000 MG CAPS Take 2 capsules by mouth daily.     Current Facility-Administered Medications  Medication Dose Route Frequency Provider Last Rate Last Admin   0.9 %  sodium chloride  infusion   Intravenous Once         Allergies:   Patient has no known allergies.    Social History:   reports that he has never smoked. He has never used smokeless tobacco. He reports that he does not drink alcohol and does not use drugs.   Family History:  family history is not on file.    ROS:     Review of Systems  Constitutional: Negative.   HENT: Negative.    Eyes: Negative.   Respiratory: Negative.    Gastrointestinal: Negative.   Genitourinary: Negative.   Musculoskeletal: Negative.   Skin: Negative.   Neurological: Negative.   Endo/Heme/Allergies: Negative.   Psychiatric/Behavioral: Negative.    All other systems reviewed and are negative.     All other systems  are reviewed and negative.    PHYSICAL EXAM: VS:  BP 128/62   Pulse 79   Ht 6' (1.829 m)   Wt 172 lb (78 kg)   SpO2 95%   BMI 23.33 kg/m  , BMI Body mass index is 23.33 kg/m. Last weight:  Wt Readings from Last 3 Encounters:  05/18/24 172 lb (78 kg)  04/23/24 174 lb 9.6 oz (79.2 kg)  02/17/24 170 lb 12.8 oz (77.5 kg)     Physical Exam Vitals reviewed.  Constitutional:      Appearance: Normal appearance. He is normal weight.  HENT:     Head: Normocephalic.     Nose: Nose normal.     Mouth/Throat:     Mouth: Mucous membranes are moist.  Eyes:     Pupils: Pupils are equal, round, and reactive to light.  Cardiovascular:     Rate and Rhythm: Normal rate and regular rhythm.     Pulses: Normal pulses.     Heart sounds: Normal heart sounds.  Pulmonary:     Effort: Pulmonary effort is normal.  Abdominal:     General: Abdomen is flat. Bowel sounds are normal.  Musculoskeletal:        General: Normal range  of motion.     Cervical back: Normal range of motion.  Skin:    General: Skin is warm.  Neurological:     General: No focal deficit present.     Mental Status: He is alert.  Psychiatric:        Mood and Affect: Mood normal.       EKG:   Recent Labs: 12/13/2023: BUN 26; Creatinine, Ser 1.10; Hemoglobin 12.9; Platelets 197; Potassium 4.6; Sodium 143 04/16/2024: ALT 16    Lipid Panel    Component Value Date/Time   CHOL 143 12/13/2023 0810   TRIG 78 12/13/2023 0810   HDL 44 12/13/2023 0810   CHOLHDL 3.3 12/13/2023 0810   LDLCALC 84 12/13/2023 0810      Other studies Reviewed: Additional studies/ records that were reviewed today include:  Review of the above records demonstrates:       No data to display            ASSESSMENT AND PLAN:    ICD-10-CM   1. Orthostatic hypotension  I95.1 PCV ECHOCARDIOGRAM COMPLETE    2. Atrial fibrillation, currently in sinus rhythm  Z86.79 PCV ECHOCARDIOGRAM COMPLETE   Being managed as rate controlled ad not  symptomatic.    3. Mixed hyperlipidemia  E78.2 PCV ECHOCARDIOGRAM COMPLETE    4. Syncope and collapse  R55 PCV ECHOCARDIOGRAM COMPLETE       Problem List Items Addressed This Visit       Cardiovascular and Mediastinum   Orthostatic hypotension - Primary   Relevant Orders   PCV ECHOCARDIOGRAM COMPLETE     Other   Mixed hyperlipidemia   Relevant Orders   PCV ECHOCARDIOGRAM COMPLETE   Syncope and collapse   Relevant Orders   PCV ECHOCARDIOGRAM COMPLETE   Atrial fibrillation, currently in sinus rhythm   Relevant Orders   PCV ECHOCARDIOGRAM COMPLETE       Disposition:   Return in about 2 months (around 07/18/2024) for echo and  f/u.    Total time spent: 35 minutes  Signed,  Mark Bathe, MD  05/18/2024 9:40 AM    [facility]

## 2024-06-01 ENCOUNTER — Other Ambulatory Visit

## 2024-06-04 ENCOUNTER — Ambulatory Visit

## 2024-06-04 DIAGNOSIS — I371 Nonrheumatic pulmonary valve insufficiency: Secondary | ICD-10-CM

## 2024-06-04 DIAGNOSIS — I340 Nonrheumatic mitral (valve) insufficiency: Secondary | ICD-10-CM

## 2024-06-04 DIAGNOSIS — E782 Mixed hyperlipidemia: Secondary | ICD-10-CM

## 2024-06-04 DIAGNOSIS — I351 Nonrheumatic aortic (valve) insufficiency: Secondary | ICD-10-CM

## 2024-06-04 DIAGNOSIS — R55 Syncope and collapse: Secondary | ICD-10-CM

## 2024-06-04 DIAGNOSIS — Z8679 Personal history of other diseases of the circulatory system: Secondary | ICD-10-CM

## 2024-06-04 DIAGNOSIS — I951 Orthostatic hypotension: Secondary | ICD-10-CM

## 2024-06-04 DIAGNOSIS — I361 Nonrheumatic tricuspid (valve) insufficiency: Secondary | ICD-10-CM | POA: Diagnosis not present

## 2024-06-08 ENCOUNTER — Ambulatory Visit: Admitting: Cardiovascular Disease

## 2024-06-08 ENCOUNTER — Encounter: Payer: Self-pay | Admitting: Cardiovascular Disease

## 2024-06-08 VITALS — BP 128/72 | HR 88 | Ht 72.0 in | Wt 177.8 lb

## 2024-06-08 DIAGNOSIS — I482 Chronic atrial fibrillation, unspecified: Secondary | ICD-10-CM | POA: Diagnosis not present

## 2024-06-08 DIAGNOSIS — R55 Syncope and collapse: Secondary | ICD-10-CM | POA: Diagnosis not present

## 2024-06-08 DIAGNOSIS — E782 Mixed hyperlipidemia: Secondary | ICD-10-CM

## 2024-06-08 DIAGNOSIS — I340 Nonrheumatic mitral (valve) insufficiency: Secondary | ICD-10-CM | POA: Diagnosis not present

## 2024-06-08 DIAGNOSIS — Z0130 Encounter for examination of blood pressure without abnormal findings: Secondary | ICD-10-CM

## 2024-06-08 DIAGNOSIS — D649 Anemia, unspecified: Secondary | ICD-10-CM | POA: Diagnosis not present

## 2024-06-08 NOTE — Progress Notes (Signed)
 Cardiology Office Note   Date:  06/08/2024   ID:  Mark Barber, DOB [birth year], MRN [id number]  PCP:  Albina GORMAN Dine, MD  Cardiologist:  Denyse Bathe, MD      History of Present Illness: Mark Barber is a 89 y.o. male who presents for  Chief Complaint  Patient presents with   Follow-up    2 month follow up    Doing well, no chest pains.      History reviewed. No pertinent past medical history.   Past Surgical History:  Procedure Laterality Date   CORONARY ANGIOPLASTY WITH STENT PLACEMENT  1994   and 1997   TOTAL HIP ARTHROPLASTY Left 1994     Current Outpatient Medications  Medication Sig Dispense Refill   apixaban  (ELIQUIS ) 5 MG TABS tablet Take 1 tablet (5 mg total) by mouth 2 (two) times daily. 60 tablet 2   atorvastatin  (LIPITOR) 40 MG tablet TAKE 1 TABLET BY MOUTH EVERY DAY 90 tablet 2   Cholecalciferol (VITAMIN D3) 250 MCG (10000 UT) capsule Take 10,000 Units by mouth daily.     metoprolol  succinate (TOPROL  XL) 25 MG 24 hr tablet Take 1 tablet (25 mg total) by mouth daily. 30 tablet 11   Omega-3 1000 MG CAPS Take 2 capsules by mouth daily.     Current Facility-Administered Medications  Medication Dose Route Frequency Provider Last Rate Last Admin   0.9 %  sodium chloride  infusion   Intravenous Once         Allergies:   Patient has no known allergies.    Social History:   reports that he has never smoked. He has never used smokeless tobacco. He reports that he does not drink alcohol and does not use drugs.   Family History:  family history is not on file.    ROS:     Review of Systems  Constitutional: Negative.   HENT: Negative.    Eyes: Negative.   Respiratory: Negative.    Gastrointestinal: Negative.   Genitourinary: Negative.   Musculoskeletal: Negative.   Skin: Negative.   Neurological: Negative.   Endo/Heme/Allergies: Negative.   Psychiatric/Behavioral: Negative.    All other systems reviewed and are negative.     All other  systems are reviewed and negative.    PHYSICAL EXAM: VS:  BP 128/72   Pulse 88   Ht 6' (1.829 m)   Wt 177 lb 12.8 oz (80.6 kg)   SpO2 98%   BMI 24.11 kg/m  , BMI Body mass index is 24.11 kg/m. Last weight:  Wt Readings from Last 3 Encounters:  06/08/24 177 lb 12.8 oz (80.6 kg)  05/18/24 172 lb (78 kg)  04/23/24 174 lb 9.6 oz (79.2 kg)     Physical Exam Vitals reviewed.  Constitutional:      Appearance: Normal appearance. He is normal weight.  HENT:     Head: Normocephalic.     Nose: Nose normal.     Mouth/Throat:     Mouth: Mucous membranes are moist.  Eyes:     Pupils: Pupils are equal, round, and reactive to light.  Cardiovascular:     Rate and Rhythm: Normal rate and regular rhythm.     Pulses: Normal pulses.     Heart sounds: Normal heart sounds.  Pulmonary:     Effort: Pulmonary effort is normal.  Abdominal:     General: Abdomen is flat. Bowel sounds are normal.  Musculoskeletal:        General: Normal  range of motion.     Cervical back: Normal range of motion.  Skin:    General: Skin is warm.  Neurological:     General: No focal deficit present.     Mental Status: He is alert.  Psychiatric:        Mood and Affect: Mood normal.       EKG:   Recent Labs: 12/13/2023: BUN 26; Creatinine, Ser 1.10; Hemoglobin 12.9; Platelets 197; Potassium 4.6; Sodium 143 04/16/2024: ALT 16    Lipid Panel    Component Value Date/Time   CHOL 143 12/13/2023 0810   TRIG 78 12/13/2023 0810   HDL 44 12/13/2023 0810   CHOLHDL 3.3 12/13/2023 0810   LDLCALC 84 12/13/2023 0810      Other studies Reviewed: Additional studies/ records that were reviewed today include:  Review of the above records demonstrates:       No data to display            ASSESSMENT AND PLAN:    ICD-10-CM   1. Syncope and collapse  R55     2. Mixed hyperlipidemia  E78.2     3. Normochromic normocytic anemia  D64.9     4. Nonrheumatic mitral valve regurgitation  I34.0     5.  Chronic atrial fibrillation (HCC)  I48.20    LVEF 56.6% on echo, doing well.       Problem List Items Addressed This Visit       Other   Mixed hyperlipidemia   Syncope and collapse - Primary   Other Visit Diagnoses       Normochromic normocytic anemia         Nonrheumatic mitral valve regurgitation         Chronic atrial fibrillation (HCC)       LVEF 56.6% on echo, doing well.          Disposition:   Return in about 3 months (around 09/08/2024).    Total time spent: 35 minutes  Signed,  Denyse Bathe, MD  06/08/2024 10:28 AM    [facility]

## 2024-07-17 ENCOUNTER — Ambulatory Visit: Admitting: Cardiovascular Disease

## 2024-07-25 ENCOUNTER — Other Ambulatory Visit: Payer: Self-pay | Admitting: Cardiovascular Disease

## 2024-07-25 DIAGNOSIS — E782 Mixed hyperlipidemia: Secondary | ICD-10-CM

## 2024-07-25 DIAGNOSIS — I340 Nonrheumatic mitral (valve) insufficiency: Secondary | ICD-10-CM

## 2024-07-25 DIAGNOSIS — I482 Chronic atrial fibrillation, unspecified: Secondary | ICD-10-CM

## 2024-07-25 DIAGNOSIS — I2510 Atherosclerotic heart disease of native coronary artery without angina pectoris: Secondary | ICD-10-CM

## 2024-08-27 ENCOUNTER — Ambulatory Visit: Admitting: Internal Medicine

## 2024-09-03 ENCOUNTER — Other Ambulatory Visit

## 2024-09-04 LAB — COMPREHENSIVE METABOLIC PANEL WITH GFR
ALT: 12 IU/L (ref 0–44)
AST: 14 IU/L (ref 0–40)
Albumin: 4.1 g/dL (ref 3.7–4.7)
Alkaline Phosphatase: 101 IU/L (ref 48–129)
BUN/Creatinine Ratio: 23 (ref 10–24)
BUN: 23 mg/dL (ref 8–27)
Bilirubin Total: 0.7 mg/dL (ref 0.0–1.2)
CO2: 21 mmol/L (ref 20–29)
Calcium: 9.5 mg/dL (ref 8.6–10.2)
Chloride: 105 mmol/L (ref 96–106)
Creatinine, Ser: 1 mg/dL (ref 0.76–1.27)
Globulin, Total: 2.5 g/dL (ref 1.5–4.5)
Glucose: 113 mg/dL — ABNORMAL HIGH (ref 70–99)
Potassium: 4.5 mmol/L (ref 3.5–5.2)
Sodium: 141 mmol/L (ref 134–144)
Total Protein: 6.6 g/dL (ref 6.0–8.5)
eGFR: 72 mL/min/1.73

## 2024-09-04 LAB — LIPID PANEL
Chol/HDL Ratio: 3.1 ratio (ref 0.0–5.0)
Cholesterol, Total: 127 mg/dL (ref 100–199)
HDL: 41 mg/dL
LDL Chol Calc (NIH): 71 mg/dL (ref 0–99)
Triglycerides: 71 mg/dL (ref 0–149)
VLDL Cholesterol Cal: 15 mg/dL (ref 5–40)

## 2024-09-04 LAB — HEMOGLOBIN A1C
Est. average glucose Bld gHb Est-mCnc: 131 mg/dL
Hgb A1c MFr Bld: 6.2 % — ABNORMAL HIGH (ref 4.8–5.6)

## 2024-09-07 ENCOUNTER — Ambulatory Visit: Admitting: Internal Medicine

## 2024-09-07 VITALS — BP 108/68 | HR 102 | Temp 97.2°F | Ht 72.0 in | Wt 177.0 lb

## 2024-09-07 DIAGNOSIS — Z8679 Personal history of other diseases of the circulatory system: Secondary | ICD-10-CM | POA: Diagnosis not present

## 2024-09-07 DIAGNOSIS — Z0130 Encounter for examination of blood pressure without abnormal findings: Secondary | ICD-10-CM

## 2024-09-07 DIAGNOSIS — E782 Mixed hyperlipidemia: Secondary | ICD-10-CM | POA: Diagnosis not present

## 2024-09-07 DIAGNOSIS — I482 Chronic atrial fibrillation, unspecified: Secondary | ICD-10-CM

## 2024-09-07 DIAGNOSIS — R7303 Prediabetes: Secondary | ICD-10-CM | POA: Diagnosis not present

## 2024-09-07 NOTE — Progress Notes (Signed)
 "  Established Patient Office Visit  Subjective:  Patient ID: Mark Barber, male    DOB: 11-03-1933  Age: 89 y.o. MRN: [id number]  Chief Complaint  Patient presents with   Follow-up    3 months follow up    No new complaints, here for lab review and medication refills. No bleeding, palpitations or falls. LDL and TC well controlled on lab review. Triglycerides also satisfactory. A1c remains in the prediabetic range.    No other concerns at this time.   No past medical history on file.  Past Surgical History:  Procedure Laterality Date   CORONARY ANGIOPLASTY WITH STENT PLACEMENT  1994   and 1997   TOTAL HIP ARTHROPLASTY Left 1994    Social History   Socioeconomic History   Marital status: Married    Spouse name: Not on file   Number of children: Not on file   Years of education: Not on file   Highest education level: Not on file  Occupational History   Not on file  Tobacco Use   Smoking status: Never   Smokeless tobacco: Never  Substance and Sexual Activity   Alcohol use: Never   Drug use: Never   Sexual activity: Yes  Other Topics Concern   Not on file  Social History Narrative   Not on file   Social Drivers of Health   Tobacco Use: Low Risk (06/08/2024)   Patient History    Smoking Tobacco Use: Never    Smokeless Tobacco Use: Never    Passive Exposure: Not on file  Financial Resource Strain: Not on file  Food Insecurity: Not on file  Transportation Needs: Not on file  Physical Activity: Not on file  Stress: Not on file  Social Connections: Not on file  Intimate Partner Violence: Not on file  Depression (PHQ2-9): Low Risk (12/19/2023)   Depression (PHQ2-9)    PHQ-2 Score: 0  Alcohol Screen: Not on file  Housing: Not on file  Utilities: Not on file  Health Literacy: Not on file    No family history on file.  Allergies[1]  Show/hide medication list[2]  Review of Systems  Constitutional: Negative.  Negative for weight loss (gained 5 lbs).   HENT: Negative.    Eyes: Negative.   Respiratory: Negative.    Gastrointestinal: Negative.   Genitourinary: Negative.   Musculoskeletal: Negative.   Skin: Negative.   Neurological: Negative.   Endo/Heme/Allergies: Negative.   Psychiatric/Behavioral: Negative.    All other systems reviewed and are negative.      Objective:   BP 108/68   Pulse (!) 102   Temp (!) 97.2 F (36.2 C) (Tympanic)   Ht 6' (1.829 m)   Wt 177 lb (80.3 kg)   SpO2 94%   BMI 24.01 kg/m   Vitals:   09/07/24 1024  BP: 108/68  Pulse: (!) 102  Temp: (!) 97.2 F (36.2 C)  Height: 6' (1.829 m)  Weight: 177 lb (80.3 kg)  SpO2: 94%  TempSrc: Tympanic  BMI (Calculated): 24    Physical Exam Vitals reviewed.  Constitutional:      Appearance: Normal appearance. He is normal weight.  HENT:     Head: Normocephalic.     Nose: Nose normal.     Mouth/Throat:     Mouth: Mucous membranes are moist.  Eyes:     Pupils: Pupils are equal, round, and reactive to light.  Cardiovascular:     Rate and Rhythm: Normal rate and regular rhythm.  Pulses: Normal pulses.     Heart sounds: Normal heart sounds.  Pulmonary:     Effort: Pulmonary effort is normal.  Abdominal:     General: Abdomen is flat. Bowel sounds are normal.  Musculoskeletal:        General: Normal range of motion.     Cervical back: Normal range of motion.  Skin:    General: Skin is warm.  Neurological:     General: No focal deficit present.     Mental Status: He is alert.  Psychiatric:        Mood and Affect: Mood normal.      No results found for any visits on 09/07/24.  Recent Results (from the past 2160 hours)  Comprehensive metabolic panel with GFR     Status: Abnormal   Collection Time: 09/03/24 10:31 AM  Result Value Ref Range   Glucose 113 (H) 70 - 99 mg/dL   BUN 23 8 - 27 mg/dL   Creatinine, Ser 8.99 0.76 - 1.27 mg/dL   eGFR 72 >40 fO/fpw/8.26   BUN/Creatinine Ratio 23 10 - 24   Sodium 141 134 - 144 mmol/L    Potassium 4.5 3.5 - 5.2 mmol/L   Chloride 105 96 - 106 mmol/L   CO2 21 20 - 29 mmol/L   Calcium  9.5 8.6 - 10.2 mg/dL   Total Protein 6.6 6.0 - 8.5 g/dL   Albumin 4.1 3.7 - 4.7 g/dL   Globulin, Total 2.5 1.5 - 4.5 g/dL   Bilirubin Total 0.7 0.0 - 1.2 mg/dL   Alkaline Phosphatase 101 48 - 129 IU/L   AST 14 0 - 40 IU/L   ALT 12 0 - 44 IU/L  Lipid panel     Status: None   Collection Time: 09/03/24 10:32 AM  Result Value Ref Range   Cholesterol, Total 127 100 - 199 mg/dL   Triglycerides 71 0 - 149 mg/dL   HDL 41 >60 mg/dL   VLDL Cholesterol Cal 15 5 - 40 mg/dL   LDL Chol Calc (NIH) 71 0 - 99 mg/dL   Chol/HDL Ratio 3.1 0.0 - 5.0 ratio    Comment:                                   T. Chol/HDL Ratio                                             Men  Women                               1/2 Avg.Risk  3.4    3.3                                   Avg.Risk  5.0    4.4                                2X Avg.Risk  9.6    7.1  3X Avg.Risk 23.4   11.0   Hemoglobin A1c     Status: Abnormal   Collection Time: 09/03/24 10:32 AM  Result Value Ref Range   Hgb A1c MFr Bld 6.2 (H) 4.8 - 5.6 %    Comment:          Prediabetes: 5.7 - 6.4          Diabetes: >6.4          Glycemic control for adults with diabetes: <7.0    Est. average glucose Bld gHb Est-mCnc 131 mg/dL      Assessment & Plan:  Mark Barber was seen today for follow-up.  Mixed hyperlipidemia -     Comprehensive metabolic panel with GFR -     Lipid panel; Standing  Chronic atrial fibrillation (HCC) -     CBC With Diff/Platelet  History of CAD (coronary artery disease)  Prediabetes    Problem List Items Addressed This Visit       Other   Mixed hyperlipidemia - Primary   Relevant Orders   Lipid panel   Other Visit Diagnoses       Chronic atrial fibrillation (HCC)       Relevant Orders   CBC With Diff/Platelet     History of CAD (coronary artery disease)         Prediabetes           Return  in about 4 months (around 01/05/2025) for awv with labs prior.   Total time spent: 20 minutes. This time includes review of previous notes and results and patient face to face interaction during today'Mark Barber visit.    Mark Cinderella Perry, MD  09/07/2024   This document may have been prepared by [facility] Voice Recognition software and as such may include unintentional dictation errors.     [1] No Known Allergies [2]  Outpatient Medications Prior to Visit  Medication Sig   apixaban  (ELIQUIS ) 5 MG TABS tablet Take 1 tablet (5 mg total) by mouth 2 (two) times daily.   atorvastatin  (LIPITOR) 40 MG tablet TAKE 1 TABLET BY MOUTH EVERY DAY   Cholecalciferol (VITAMIN D3) 250 MCG (10000 UT) capsule Take 10,000 Units by mouth daily.   metoprolol  succinate (TOPROL -XL) 25 MG 24 hr tablet TAKE 1 TABLET (25 MG TOTAL) BY MOUTH DAILY.   Omega-3 1000 MG CAPS Take 2 capsules by mouth daily.   Facility-Administered Medications Prior to Visit  Medication Dose Route Frequency Provider   0.9 %  sodium chloride  infusion   Intravenous Once    "

## 2024-09-17 ENCOUNTER — Encounter: Payer: Self-pay | Admitting: Cardiovascular Disease

## 2024-09-17 ENCOUNTER — Ambulatory Visit: Admitting: Cardiovascular Disease

## 2024-09-17 VITALS — BP 112/64 | HR 70 | Ht 72.0 in | Wt 175.2 lb

## 2024-09-17 DIAGNOSIS — E782 Mixed hyperlipidemia: Secondary | ICD-10-CM | POA: Diagnosis not present

## 2024-09-17 DIAGNOSIS — I340 Nonrheumatic mitral (valve) insufficiency: Secondary | ICD-10-CM

## 2024-09-17 DIAGNOSIS — I482 Chronic atrial fibrillation, unspecified: Secondary | ICD-10-CM | POA: Diagnosis not present

## 2024-09-17 DIAGNOSIS — R7303 Prediabetes: Secondary | ICD-10-CM

## 2024-09-17 DIAGNOSIS — I4811 Longstanding persistent atrial fibrillation: Secondary | ICD-10-CM

## 2024-09-17 DIAGNOSIS — Z8679 Personal history of other diseases of the circulatory system: Secondary | ICD-10-CM

## 2024-09-17 DIAGNOSIS — R0602 Shortness of breath: Secondary | ICD-10-CM

## 2024-09-17 DIAGNOSIS — R55 Syncope and collapse: Secondary | ICD-10-CM

## 2024-09-17 DIAGNOSIS — I2510 Atherosclerotic heart disease of native coronary artery without angina pectoris: Secondary | ICD-10-CM

## 2024-09-17 DIAGNOSIS — Z0130 Encounter for examination of blood pressure without abnormal findings: Secondary | ICD-10-CM

## 2024-09-17 NOTE — Progress Notes (Signed)
 "     Cardiology Office Note   Date:  09/17/2024   ID:  HJALMER IOVINO, DOB [birth year], MRN [id number]  PCP:  Albina GORMAN Dine, MD  Cardiologist:  Denyse Bathe, MD      History of Present Illness: Mark Barber is a 89 y.o. male who presents for  Chief Complaint  Patient presents with   Follow-up    Follow up    No complaints.      History reviewed. No pertinent past medical history.   Past Surgical History:  Procedure Laterality Date   CORONARY ANGIOPLASTY WITH STENT PLACEMENT  1994   and 1997   TOTAL HIP ARTHROPLASTY Left 1994     Current Outpatient Medications  Medication Sig Dispense Refill   apixaban  (ELIQUIS ) 5 MG TABS tablet Take 1 tablet (5 mg total) by mouth 2 (two) times daily. 60 tablet 2   atorvastatin  (LIPITOR) 40 MG tablet TAKE 1 TABLET BY MOUTH EVERY DAY 90 tablet 2   Cholecalciferol (VITAMIN D3) 250 MCG (10000 UT) capsule Take 10,000 Units by mouth daily.     Omega-3 1000 MG CAPS Take 2 capsules by mouth daily.     metoprolol  succinate (TOPROL -XL) 25 MG 24 hr tablet TAKE 1 TABLET (25 MG TOTAL) BY MOUTH DAILY. (Patient not taking: Reported on 09/17/2024) 90 tablet 3   Current Facility-Administered Medications  Medication Dose Route Frequency Provider Last Rate Last Admin   0.9 %  sodium chloride  infusion   Intravenous Once         Allergies:   Patient has no known allergies.    Social History:   reports that he has never smoked. He has never used smokeless tobacco. He reports that he does not drink alcohol and does not use drugs.   Family History:  family history is not on file.    ROS:     Review of Systems  Constitutional: Negative.   HENT: Negative.    Eyes: Negative.   Respiratory: Negative.    Gastrointestinal: Negative.   Genitourinary: Negative.   Musculoskeletal: Negative.   Skin: Negative.   Neurological: Negative.   Endo/Heme/Allergies: Negative.   Psychiatric/Behavioral: Negative.    All other systems reviewed and are  negative.     All other systems are reviewed and negative.    PHYSICAL EXAM: VS:  BP 112/64   Pulse 70   Ht 6' (1.829 m)   Wt 175 lb 3.2 oz (79.5 kg)   SpO2 97%   BMI 23.76 kg/m  , BMI Body mass index is 23.76 kg/m. Last weight:  Wt Readings from Last 3 Encounters:  09/17/24 175 lb 3.2 oz (79.5 kg)  09/07/24 177 lb (80.3 kg)  06/08/24 177 lb 12.8 oz (80.6 kg)     Physical Exam Vitals reviewed.  Constitutional:      Appearance: Normal appearance. He is normal weight.  HENT:     Head: Normocephalic.     Nose: Nose normal.     Mouth/Throat:     Mouth: Mucous membranes are moist.  Eyes:     Pupils: Pupils are equal, round, and reactive to light.  Cardiovascular:     Rate and Rhythm: Normal rate and regular rhythm.     Pulses: Normal pulses.     Heart sounds: Normal heart sounds.  Pulmonary:     Effort: Pulmonary effort is normal.  Abdominal:     General: Abdomen is flat. Bowel sounds are normal.  Musculoskeletal:  General: Normal range of motion.     Cervical back: Normal range of motion.  Skin:    General: Skin is warm.  Neurological:     General: No focal deficit present.     Mental Status: He is alert.  Psychiatric:        Mood and Affect: Mood normal.       EKG:   Recent Labs: 12/13/2023: Hemoglobin 12.9; Platelets 197 09/03/2024: ALT 12; BUN 23; Creatinine, Ser 1.00; Potassium 4.5; Sodium 141    Lipid Panel    Component Value Date/Time   CHOL 127 09/03/2024 1032   TRIG 71 09/03/2024 1032   HDL 41 09/03/2024 1032   CHOLHDL 3.1 09/03/2024 1032   LDLCALC 71 09/03/2024 1032      Other studies Reviewed: Additional studies/ records that were reviewed today include:  Review of the above records demonstrates:       No data to display            ASSESSMENT AND PLAN:    ICD-10-CM   1. Longstanding persistent atrial fibrillation (HCC)  I48.11    Asymptomatic.    2. History of CAD (coronary artery disease)  Z86.79     3. Chronic  atrial fibrillation (HCC)  I48.20     4. Mixed hyperlipidemia  E78.2     5. Nonrheumatic mitral valve regurgitation  I34.0     6. Coronary artery disease involving native coronary artery of native heart without angina pectoris  I25.10     7. Syncope and collapse  R55     8. SOB (shortness of breath)  R06.02        Problem List Items Addressed This Visit       Other   Mixed hyperlipidemia   Syncope and collapse   Other Visit Diagnoses       Longstanding persistent atrial fibrillation (HCC)    -  Primary   Asymptomatic.     History of CAD (coronary artery disease)         Chronic atrial fibrillation (HCC)         Nonrheumatic mitral valve regurgitation         Coronary artery disease involving native coronary artery of native heart without angina pectoris         SOB (shortness of breath)              Disposition:   Return in about 3 months (around 12/16/2024).    Total time spent: 35 minutes  Signed,  Denyse Bathe, MD  09/17/2024 10:49 AM    [facility] "

## 2024-12-17 ENCOUNTER — Ambulatory Visit: Admitting: Cardiovascular Disease

## 2025-01-07 ENCOUNTER — Ambulatory Visit: Admitting: Internal Medicine
